# Patient Record
Sex: Male | Born: 1990 | Race: Black or African American | Hispanic: No | Marital: Single | Smoking: Former smoker
Health system: Southern US, Community
[De-identification: ages and names within clinical notes are randomized; demographics above are authoritative.]

## PROBLEM LIST (undated history)

## (undated) DIAGNOSIS — J301 Allergic rhinitis due to pollen: Secondary | ICD-10-CM

## (undated) HISTORY — DX: Allergic rhinitis due to pollen: J30.1

---
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
## 2009-06-29 ENCOUNTER — Emergency Department (HOSPITAL_COMMUNITY): Admission: EM | Admit: 2009-06-29 | Discharge: 2009-06-29 | Payer: Self-pay | Admitting: Emergency Medicine

## 2010-04-03 ENCOUNTER — Emergency Department (HOSPITAL_COMMUNITY)
Admission: EM | Admit: 2010-04-03 | Discharge: 2010-04-03 | Disposition: A | Discharge status: Home / Self Care | Payer: No Typology Code available for payment source | Attending: Emergency Medicine | Admitting: Emergency Medicine

## 2010-04-03 DIAGNOSIS — M546 Pain in thoracic spine: Secondary | ICD-10-CM | POA: Insufficient documentation

## 2010-04-03 DIAGNOSIS — M542 Cervicalgia: Secondary | ICD-10-CM | POA: Insufficient documentation

## 2010-04-03 DIAGNOSIS — Y929 Unspecified place or not applicable: Secondary | ICD-10-CM | POA: Insufficient documentation

## 2014-09-04 ENCOUNTER — Emergency Department (INDEPENDENT_AMBULATORY_CARE_PROVIDER_SITE_OTHER)
Admission: EM | Admit: 2014-09-04 | Discharge: 2014-09-04 | Disposition: A | Discharge status: Home / Self Care | Payer: Self-pay | Source: Home / Self Care

## 2014-09-04 ENCOUNTER — Encounter (HOSPITAL_COMMUNITY): Payer: Self-pay | Admitting: Emergency Medicine

## 2014-09-04 DIAGNOSIS — B349 Viral infection, unspecified: Secondary | ICD-10-CM

## 2014-09-04 DIAGNOSIS — G4452 New daily persistent headache (NDPH): Secondary | ICD-10-CM

## 2014-09-04 MED ORDER — TRAMADOL HCL 50 MG PO TABS: 50.0000 mg | ORAL_TABLET | Freq: Four times a day (QID) | ORAL | Status: DC | PRN

## 2014-09-04 MED ORDER — KETOROLAC TROMETHAMINE 10 MG PO TABS: ORAL_TABLET | ORAL | Status: DC

## 2014-09-04 NOTE — ED Notes (Signed)
Call from pharmacy about this patient recent d/c and toradol rx. NP referred to pharmacy, and he is to call them

## 2014-09-04 NOTE — ED Notes (Signed)
Call from patient. States he was told by the pharmacy they will not fill his rx for toradol tablets since he did not receive an injection while still at the clinic. Discussed this with the provider, who recommends the patient take the Rx to another pharmacy. Patient has been advised of the instructions from the provider

## 2014-09-04 NOTE — ED Notes (Signed)
Reports headache that started on Friday, 7/1.  General aches.  Denies sore throat, ear pain, no abdominal pain or complaints.

## 2014-09-04 NOTE — Discharge Instructions (Signed)
Viral Infections A viral infection can be caused by different types of viruses.Most viral infections are not serious and resolve on their own. However, some infections may cause severe symptoms and may lead to further complications. SYMPTOMS Viruses can frequently cause:  Minor sore throat.  Aches and pains.  Headaches.  Runny nose.  Different types of rashes.  Watery eyes.  Tiredness.  Cough.  Loss of appetite.  Gastrointestinal infections, resulting in nausea, vomiting, and diarrhea. These symptoms do not respond to antibiotics because the infection is not caused by bacteria. However, you might catch a bacterial infection following the viral infection. This is sometimes called a "superinfection." Symptoms of such a bacterial infection may include:  Worsening sore throat with pus and difficulty swallowing.  Swollen neck glands.  Chills and a high or persistent fever.  Severe headache.  Tenderness over the sinuses.  Persistent overall ill feeling (malaise), muscle aches, and tiredness (fatigue).  Persistent cough.  Yellow, green, or brown mucus production with coughing. HOME CARE INSTRUCTIONS   Only take over-the-counter or prescription medicines for pain, discomfort, diarrhea, or fever as directed by your caregiver.  Drink enough water and fluids to keep your urine clear or pale yellow. Sports drinks can provide valuable electrolytes, sugars, and hydration.  Get plenty of rest and maintain proper nutrition. Soups and broths with crackers or rice are fine. SEEK IMMEDIATE MEDICAL CARE IF:   You have severe headaches, shortness of breath, chest pain, neck pain, or an unusual rash.  You have uncontrolled vomiting, diarrhea, or you are unable to keep down fluids.  You or your child has an oral temperature above 102 F (38.9 C), not controlled by medicine.  Your baby is older than 3 months with a rectal temperature of 102 F (38.9 C) or higher.  Your baby is 1  months old or younger with a rectal temperature of 100.4 F (38 C) or higher. MAKE SURE YOU:   Understand these instructions.  Will watch your condition.  Will get help right away if you are not doing well or get worse. Document Released: 11/27/2004 Document Revised: 05/12/2011 Document Reviewed: 06/24/2010 [facility] Patient Information 2015 [place], [place]. This information is not intended to replace advice given to you by your health care provider. Make sure you discuss any questions you have with your health care provider.  Headaches, Frequently Asked Questions MIGRAINE HEADACHES Q: What is migraine? What causes it? How can I treat it? A: Generally, migraine headaches begin as a dull ache. Then they develop into a constant, throbbing, and pulsating pain. You may experience pain at the temples. You may experience pain at the front or back of one or both sides of the head. The pain is usually accompanied by a combination of:  Nausea.  Vomiting.  Sensitivity to light and noise. Some people (about 15%) experience an aura (see below) before an attack. The cause of migraine is believed to be chemical reactions in the brain. Treatment for migraine may include over-the-counter or prescription medications. It may also include self-help techniques. These include relaxation training and biofeedback.  Q: What is an aura? A: About 15% of people with migraine get an "aura". This is a sign of neurological symptoms that occur before a migraine headache. You may see wavy or jagged lines, dots, or flashing lights. You might experience tunnel vision or blind spots in one or both eyes. The aura can include visual or auditory hallucinations (something imagined). It may include disruptions in smell (such as strange odors),  taste or touch. Other symptoms include:  Numbness.  A "pins and needles" sensation.  Difficulty in recalling or speaking the correct word. These neurological events may last as long as  60 minutes. These symptoms will fade as the headache begins. Q: What is a trigger? A: Certain physical or environmental factors can lead to or "trigger" a migraine. These include:  Foods.  Hormonal changes.  Weather.  Stress. It is important to remember that triggers are different for everyone. To help prevent migraine attacks, you need to figure out which triggers affect you. Keep a headache diary. This is a good way to track triggers. The diary will help you talk to your healthcare professional about your condition. Q: Does weather affect migraines? A: Bright sunshine, hot, humid conditions, and drastic changes in barometric pressure may lead to, or "trigger," a migraine attack in some people. But studies have shown that weather does not act as a trigger for everyone with migraines. Q: What is the link between migraine and hormones? A: Hormones start and regulate many of your body's functions. Hormones keep your body in balance within a constantly changing environment. The levels of hormones in your body are unbalanced at times. Examples are during menstruation, pregnancy, or menopause. That can lead to a migraine attack. In fact, about three quarters of all women with migraine report that their attacks are related to the menstrual cycle.  Q: Is there an increased risk of stroke for migraine sufferers? A: The likelihood of a migraine attack causing a stroke is very remote. That is not to say that migraine sufferers cannot have a stroke associated with their migraines. In persons under age 45, the most common associated factor for stroke is migraine headache. But over the course of a person's normal life span, the occurrence of migraine headache may actually be associated with a reduced risk of dying from cerebrovascular disease due to stroke.  Q: What are acute medications for migraine? A: Acute medications are used to treat the pain of the headache after it has started. Examples  over-the-counter medications, NSAIDs, ergots, and triptans.  Q: What are the triptans? A: Triptans are the newest class of abortive medications. They are specifically targeted to treat migraine. Triptans are vasoconstrictors. They moderate some chemical reactions in the brain. The triptans work on receptors in your brain. Triptans help to restore the balance of a neurotransmitter called serotonin. Fluctuations in levels of serotonin are thought to be a main cause of migraine.  Q: Are over-the-counter medications for migraine effective? A: Over-the-counter, or "OTC," medications may be effective in relieving mild to moderate pain and associated symptoms of migraine. But you should see your caregiver before beginning any treatment regimen for migraine.  Q: What are preventive medications for migraine? A: Preventive medications for migraine are sometimes referred to as "prophylactic" treatments. They are used to reduce the frequency, severity, and length of migraine attacks. Examples of preventive medications include antiepileptic medications, antidepressants, beta-blockers, calcium channel blockers, and NSAIDs (nonsteroidal anti-inflammatory drugs). Q: Why are anticonvulsants used to treat migraine? A: During the past few years, there has been an increased interest in antiepileptic drugs for the prevention of migraine. They are sometimes referred to as "anticonvulsants". Both epilepsy and migraine may be caused by similar reactions in the brain.  Q: Why are antidepressants used to treat migraine? A: Antidepressants are typically used to treat people with depression. They may reduce migraine frequency by regulating chemical levels, such as serotonin, in the brain.  Q: What  alternative therapies are used to treat migraine? A: The term "alternative therapies" is often used to describe treatments considered outside the scope of conventional Western medicine. Examples of alternative therapy include acupuncture,  acupressure, and yoga. Another common alternative treatment is herbal therapy. Some herbs are believed to relieve headache pain. Always discuss alternative therapies with your caregiver before proceeding. Some herbal products contain arsenic and other toxins. TENSION HEADACHES Q: What is a tension-type headache? What causes it? How can I treat it? A: Tension-type headaches occur randomly. They are often the result of temporary stress, anxiety, fatigue, or anger. Symptoms include soreness in your temples, a tightening band-like sensation around your head (a "vice-like" ache). Symptoms can also include a pulling feeling, pressure sensations, and contracting head and neck muscles. The headache begins in your forehead, temples, or the back of your head and neck. Treatment for tension-type headache may include over-the-counter or prescription medications. Treatment may also include self-help techniques such as relaxation training and biofeedback. CLUSTER HEADACHES Q: What is a cluster headache? What causes it? How can I treat it? A: Cluster headache gets its name because the attacks come in groups. The pain arrives with little, if any, warning. It is usually on one side of the head. A tearing or bloodshot eye and a runny nose on the same side of the headache may also accompany the pain. Cluster headaches are believed to be caused by chemical reactions in the brain. They have been described as the most severe and intense of any headache type. Treatment for cluster headache includes prescription medication and oxygen. SINUS HEADACHES Q: What is a sinus headache? What causes it? How can I treat it? A: When a cavity in the bones of the face and skull (a sinus) becomes inflamed, the inflammation will cause localized pain. This condition is usually the result of an allergic reaction, a tumor, or an infection. If your headache is caused by a sinus blockage, such as an infection, you will probably have a fever. An x-ray  will confirm a sinus blockage. Your caregiver's treatment might include antibiotics for the infection, as well as antihistamines or decongestants.  REBOUND HEADACHES Q: What is a rebound headache? What causes it? How can I treat it? A: A pattern of taking acute headache medications too often can lead to a condition known as "rebound headache." A pattern of taking too much headache medication includes taking it more than 2 days per week or in excessive amounts. That means more than the label or a caregiver advises. With rebound headaches, your medications not only stop relieving pain, they actually begin to cause headaches. Doctors treat rebound headache by tapering the medication that is being overused. Sometimes your caregiver will gradually substitute a different type of treatment or medication. Stopping may be a challenge. Regularly overusing a medication increases the potential for serious side effects. Consult a caregiver if you regularly use headache medications more than 2 days per week or more than the label advises. ADDITIONAL QUESTIONS AND ANSWERS Q: What is biofeedback? A: Biofeedback is a self-help treatment. Biofeedback uses special equipment to monitor your body's involuntary physical responses. Biofeedback monitors:  Breathing.  Pulse.  Heart rate.  Temperature.  Muscle tension.  Brain activity. Biofeedback helps you refine and perfect your relaxation exercises. You learn to control the physical responses that are related to stress. Once the technique has been mastered, you do not need the equipment any more. Q: Are headaches hereditary? A: Four out of five (80%) of people  that suffer report a family history of migraine. Scientists are not sure if this is genetic or a family predisposition. Despite the uncertainty, a child has a 50% chance of having migraine if one parent suffers. The child has a 75% chance if both parents suffer.  Q: Can children get headaches? A: By the time  they reach high school, most young people have experienced some type of headache. Many safe and effective approaches or medications can prevent a headache from occurring or stop it after it has begun.  Q: What type of doctor should I see to diagnose and treat my headache? A: Start with your primary caregiver. Discuss his or her experience and approach to headaches. Discuss methods of classification, diagnosis, and treatment. Your caregiver may decide to recommend you to a headache specialist, depending upon your symptoms or other physical conditions. Having diabetes, allergies, etc., may require a more comprehensive and inclusive approach to your headache. The National Headache Foundation will provide, upon request, a list of [facility] physician members in your state. Document Released: 05/10/2003 Document Revised: 05/12/2011 Document Reviewed: 10/18/2007 [facility] Patient Information 2015 [place], [place]. This information is not intended to replace advice given to you by your health care provider. Make sure you discuss any questions you have with your health care provider.

## 2014-09-04 NOTE — ED Provider Notes (Signed)
CSN: [id number]     Arrival date & time 09/04/14  1445 History   None    Chief Complaint  Patient presents with  . Headache   (Consider location/radiation/quality/duration/timing/severity/associated sxs/prior Treatment) HPI Comments: 24 year old male developed a nonradiating headache approximate 3 days ago. It is located to the vertex and biparietal areas of the cranium. He describes it as throbbing and is worse when lying down or turning his head. Is also complaining of myalgias, soreness all over, fatigue, malaise. He initially denied fever at where he states when his girlfriend take his temperature was 100.6 at home. Denies sore throat or earache. Denies problems with visions, speech, hearing, swallowing. Denies focal paresthesias or weakness.   History reviewed. No pertinent past medical history. History reviewed. No pertinent past surgical history. No family history on file. History  Substance Use Topics  . Smoking status: Never Smoker   . Smokeless tobacco: Not on file  . Alcohol Use: Yes    Review of Systems  Constitutional: Positive for activity change and fatigue. Negative for chills.  HENT: Negative for congestion, ear discharge, facial swelling, postnasal drip, sinus pressure and sore throat.   Eyes: Negative.   Respiratory: Negative for cough, choking, chest tightness and shortness of breath.   Cardiovascular: Negative for chest pain and leg swelling.  Gastrointestinal: Negative.   Genitourinary: Negative.   Skin: Negative.  Negative for rash.  Neurological: Positive for headaches. Negative for dizziness, tremors, seizures, syncope, facial asymmetry, speech difficulty, weakness, light-headedness and numbness.  Psychiatric/Behavioral: Negative.     Allergies  Review of patient's allergies indicates no known allergies.  Home Medications   Prior to Admission medications   Medication Sig Start Date End Date Taking? Authorizing Provider  Aspirin-Acetaminophen-Caffeine  (EXCEDRIN PO) Take by mouth.   Yes Historical Provider, MD  OVER THE COUNTER MEDICATION Has taken pain medicines left over from prior dental extractions   Yes Historical Provider, MD  ketorolac (TORADOL) 10 MG tablet Take 2 tablets now, then 1 po q 6h prn pain 09/04/14   Hayden Rasmussenavid Zianne Schubring, NP  traMADol (ULTRAM) 50 MG tablet Take 1 tablet (50 mg total) by mouth every 6 (six) hours as needed. 09/04/14   Hayden Rasmussenavid Fredia Chittenden, NP   BP 122/76 mmHg  Pulse 64  Temp(Src) 98.5 F (36.9 C) (Oral)  Resp 16  SpO2 100% Physical Exam  Constitutional: He is oriented to person, place, and time. He appears well-developed and well-nourished. No distress.  HENT:  Head: Normocephalic and atraumatic.  Bilateral TMs are normal Oropharynx is clear and moist without exudates or erythema. Soft palate rises symmetrically. Tongue and uvula midline.  Eyes: Conjunctivae and EOM are normal. Pupils are equal, round, and reactive to light. Right eye exhibits no discharge. Left eye exhibits no discharge.  Neck: Normal range of motion. Neck supple.  Cardiovascular: Normal rate, regular rhythm and normal heart sounds.   Pulmonary/Chest: Effort normal and breath sounds normal. No respiratory distress. He has no wheezes. He has no rales.  Musculoskeletal: Normal range of motion. He exhibits no edema.  Lymphadenopathy:    He has no cervical adenopathy.  Neurological: He is alert and oriented to person, place, and time. He has normal strength. He displays no tremor. No cranial nerve deficit or sensory deficit. He exhibits normal muscle tone. He displays a negative Romberg sign. Coordination and gait normal. GCS eye subscore is 4. GCS verbal subscore is 5. GCS motor subscore is 6.  Reflex Scores:      Patellar reflexes are 2+  on the right side and 2+ on the left side. No dysmetria  Skin: Skin is warm and dry. No rash noted.  Psychiatric: He has a normal mood and affect.  Nursing note and vitals reviewed.   ED Course  Procedures (including  critical care time) Labs Review Labs Reviewed - No data to display  Imaging Review No results found.   MDM   1. Viral syndrome   2. New daily persistent headache    Suspect with a combination of general weakness, myalgias, muscle soreness, low-grade fever and headache at the vertex this is a viral type syndrome. Neurological exam is unremarkable. He appears well otherwise. Rest, drink plenty of fluids stay well-hydrated Tramadol 50 mg 1 every 6 hours as needed for pain or headache Acute oral activity milligrams 2 now then one by mouth every 6 hours when necessary headache discomfort.       Hayden Rasmussen, NP 09/04/14 [phone]

## 2017-11-26 ENCOUNTER — Ambulatory Visit (INDEPENDENT_AMBULATORY_CARE_PROVIDER_SITE_OTHER): Payer: BLUE CROSS/BLUE SHIELD | Admitting: Family Medicine

## 2017-11-26 ENCOUNTER — Encounter: Payer: Self-pay | Admitting: Family Medicine

## 2017-11-26 VITALS — BP 118/70 | HR 68 | Temp 98.9°F | Ht 68.0 in | Wt 166.6 lb

## 2017-11-26 DIAGNOSIS — Z113 Encounter for screening for infections with a predominantly sexual mode of transmission: Secondary | ICD-10-CM | POA: Diagnosis not present

## 2017-11-26 DIAGNOSIS — Z0001 Encounter for general adult medical examination with abnormal findings: Secondary | ICD-10-CM | POA: Diagnosis not present

## 2017-11-26 DIAGNOSIS — Z0000 Encounter for general adult medical examination without abnormal findings: Secondary | ICD-10-CM

## 2017-11-26 DIAGNOSIS — J302 Other seasonal allergic rhinitis: Secondary | ICD-10-CM

## 2017-11-26 DIAGNOSIS — N489 Disorder of penis, unspecified: Secondary | ICD-10-CM

## 2017-11-26 MED ORDER — CLOTRIMAZOLE 1 % EX CREA: 1.0000 "application " | TOPICAL_CREAM | Freq: Two times a day (BID) | CUTANEOUS | 0 refills | Status: DC

## 2017-11-26 NOTE — Patient Instructions (Signed)
Preventive Care 18-39 Years, Male Preventive care refers to lifestyle choices and visits with your health care provider that can promote health and wellness. What does preventive care include?  A yearly physical exam. This is also called an annual well check.  Dental exams once or twice a year.  Routine eye exams. Ask your health care provider how often you should have your eyes checked.  Personal lifestyle choices, including: ? Daily care of your teeth and gums. ? Regular physical activity. ? Eating a healthy diet. ? Avoiding tobacco and drug use. ? Limiting alcohol use. ? Practicing safe sex. What happens during an annual well check? The services and screenings done by your health care provider during your annual well check will depend on your age, overall health, lifestyle risk factors, and family history of disease. Counseling Your health care provider may ask you questions about your:  Alcohol use.  Tobacco use.  Drug use.  Emotional well-being.  Home and relationship well-being.  Sexual activity.  Eating habits.  Work and work Statistician.  Screening You may have the following tests or measurements:  Height, weight, and BMI.  Blood pressure.  Lipid and cholesterol levels. These may be checked every 5 years starting at age 34.  Diabetes screening. This is done by checking your blood sugar (glucose) after you have not eaten for a while (fasting).  Skin check.  Hepatitis C blood test.  Hepatitis B blood test.  Sexually transmitted disease (STD) testing.  Discuss your test results, treatment options, and if necessary, the need for more tests with your health care provider. Vaccines Your health care provider may recommend certain vaccines, such as:  Influenza vaccine. This is recommended every year.  Tetanus, diphtheria, and acellular pertussis (Tdap, Td) vaccine. You may need a Td booster every 10 years.  Varicella vaccine. You may need this if you  have not been vaccinated.  HPV vaccine. If you are 23 or younger, you may need three doses over 6 months.  Measles, mumps, and rubella (MMR) vaccine. You may need at least one dose of MMR.You may also need a second dose.  Pneumococcal 13-valent conjugate (PCV13) vaccine. You may need this if you have certain conditions and have not been vaccinated.  Pneumococcal polysaccharide (PPSV23) vaccine. You may need one or two doses if you smoke cigarettes or if you have certain conditions.  Meningococcal vaccine. One dose is recommended if you are age 65-21 years and a first-year college student living in a residence hall, or if you have one of several medical conditions. You may also need additional booster doses.  Hepatitis A vaccine. You may need this if you have certain conditions or if you travel or work in places where you may be exposed to hepatitis A.  Hepatitis B vaccine. You may need this if you have certain conditions or if you travel or work in places where you may be exposed to hepatitis B.  Haemophilus influenzae type b (Hib) vaccine. You may need this if you have certain risk factors.  Talk to your health care provider about which screenings and vaccines you need and how often you need them. This information is not intended to replace advice given to you by your health care provider. Make sure you discuss any questions you have with your health care provider. Document Released: 04/15/2001 Document Revised: 11/07/2015 Document Reviewed: 12/19/2014 Elsevier Interactive Patient Education  [employer].

## 2017-11-26 NOTE — Progress Notes (Signed)
Subjective:     Jose Dean is a 27 y.o. male and is here for a comprehensive physical exam. The patient reports problems - penile concern.  Pt would also like STI testing.  Has been in a relationship x 8 months, not using condoms.  Reports an area on his penis that has been slow to heal.  Pt notes area initially started as a small bump, the bump has resolved, but the area is dry.  Pt denies any burning, discharge, or urinary complaints.  Pt notes h/o seasonal allergies.  Not currently taking anything.  Allergies: NKDA  Social hx: Pt is single.  He is employed as a Naval architect.  Pt endorses EtOH use.  Pt not currently using tobacco products, endorses smoking in the past.  Social History   Socioeconomic History  . Marital status: Single    Spouse name: Not on file  . Number of children: Not on file  . Years of education: Not on file  . Highest education level: Not on file  Occupational History  . Not on file  Social Needs  . Financial resource strain: Not on file  . Food insecurity:    Worry: Not on file    Inability: Not on file  . Transportation needs:    Medical: Not on file    Non-medical: Not on file  Tobacco Use  . Smoking status: Former Games developer  . Smokeless tobacco: Never Used  Substance and Sexual Activity  . Alcohol use: Yes  . Drug use: No  . Sexual activity: Yes  Lifestyle  . Physical activity:    Days per week: Not on file    Minutes per session: Not on file  . Stress: Not on file  Relationships  . Social connections:    Talks on phone: Not on file    Gets together: Not on file    Attends religious service: Not on file    Active member of club or organization: Not on file    Attends meetings of clubs or organizations: Not on file    Relationship status: Not on file  . Intimate partner violence:    Fear of current or ex partner: Not on file    Emotionally abused: Not on file    Physically abused: Not on file    Forced sexual activity: Not on file   Other Topics Concern  . Not on file  Social History Narrative  . Not on file   Health Maintenance  Topic Date Due  . HIV Screening  08/20/2003  . TETANUS/TDAP  08/20/2007  . INFLUENZA VACCINE  10/01/2017    The following portions of the patient's history were reviewed and updated as appropriate: allergies, current medications, past family history, past medical history, past social history, past surgical history and problem list.  Review of Systems A comprehensive review of systems was negative.   +penile lesion  Objective:    BP 118/70 (BP Location: Left Arm, Patient Position: Sitting, Cuff Size: Normal)   Pulse 68   Temp 98.9 F (37.2 C) (Oral)   Ht 5\' 8"  (1.727 m)   Wt 166 lb 9.6 oz (75.6 kg)   SpO2 98%   BMI 25.33 kg/m  General appearance: alert, cooperative and no distress Head: Normocephalic, without obvious abnormality, atraumatic Eyes: conjunctivae/corneas clear. PERRL, EOM's intact. Fundi benign. Ears: normal TM's and external ear canals both ears Nose: Nares normal. Septum midline. Mucosa normal. No drainage or sinus tenderness. Throat: lips, mucosa, and tongue normal; teeth and  gums normal Neck: no adenopathy, no carotid bruit, no JVD, supple, symmetrical, trachea midline and thyroid not enlarged, symmetric, no tenderness/mass/nodules Lungs: clear to auscultation bilaterally Heart: regular rate and rhythm, S1, S2 normal, no murmur, click, rub or gallop Abdomen: soft, non-tender; bowel sounds normal; no masses,  no organomegaly Male genitalia: Circumcised male. skin of penile shaft dry, 1 cm area of dry slightly hyperpigmented skin on L lateral penis.  no pustules, erythema, or drainage noted. Extremities: extremities normal, atraumatic, no cyanosis or edema Skin: Skin color, texture, turgor normal. No rashes or lesions Neurologic: Alert and oriented X 3, normal strength and tone. Normal symmetric reflexes. Normal coordination and gait    Assessment:    Healthy  male exam with penile lesion.     Plan:     Anticipatory guidance given including wearing seatbelts, smoke detectors in the home, increasing physical activity, increasing p.o. intake of water and vegetables. -will obtain labs for STI screening -given handouts -offered influenza vaccine, pt declined -next CPE in 1 yr See After Visit Summary for Counseling Recommendations    Penile lesion -unclear as to how original lesion appeared. -discussed possible causes including HSV infection, tinea, dermatitis, etc. -HSV testing -lotrimazole cream -pt given RTC precautions if area becomes worse or pt notes other lesions  Seasonal allergies -OTC med prn  F/u prn  Abbe Amsterdam, MD

## 2017-11-27 LAB — HIV ANTIBODY (ROUTINE TESTING W REFLEX): HIV 1&2 Ab, 4th Generation: NONREACTIVE

## 2017-11-27 LAB — C. TRACHOMATIS/N. GONORRHOEAE RNA
C. trachomatis RNA, TMA: NOT DETECTED
N. gonorrhoeae RNA, TMA: NOT DETECTED

## 2017-11-27 LAB — RPR: RPR: NONREACTIVE

## 2017-11-27 LAB — HSV(HERPES SIMPLEX VRS) I + II AB-IGG: HSV 1 IGG, TYPE SPEC: 0.9 {index} — AB

## 2017-11-29 ENCOUNTER — Encounter: Payer: Self-pay | Admitting: Family Medicine

## 2017-11-30 ENCOUNTER — Telehealth: Payer: Self-pay | Admitting: *Deleted

## 2017-11-30 NOTE — Telephone Encounter (Signed)
Copied from CRM [id number]. Topic: Inquiry >> Nov 30, 2017  2:17 PM Baldo Daub L wrote: Reason for CRM:   Pt calling to find out if results were in.  Pt can be reached at [phone]

## 2017-12-01 NOTE — Telephone Encounter (Signed)
Pt has already been given his Lab results

## 2018-05-14 ENCOUNTER — Other Ambulatory Visit: Payer: Self-pay

## 2018-05-14 ENCOUNTER — Ambulatory Visit (INDEPENDENT_AMBULATORY_CARE_PROVIDER_SITE_OTHER): Payer: BLUE CROSS/BLUE SHIELD | Admitting: Family Medicine

## 2018-05-14 ENCOUNTER — Encounter: Payer: Self-pay | Admitting: Family Medicine

## 2018-05-14 VITALS — BP 110/68 | HR 76 | Temp 98.5°F | Wt 179.0 lb

## 2018-05-14 DIAGNOSIS — N529 Male erectile dysfunction, unspecified: Secondary | ICD-10-CM

## 2018-05-14 NOTE — Progress Notes (Signed)
Subjective:    Patient ID: Jose Dean, male    DOB: [birth year], 28 y.o.   MRN: [id number]  No chief complaint on file.   HPI Patient was seen today for f/u.  Pt endorses ED.  States unable to maintain erection during sex when changes position.  Denies increased stress or being distracted.  Pt went to [facility] on [address] receiving Testosterone injections weekly.  Endorses recent blood work at the clinic.  Notes continued issues.  Drinking mostly water, may have 1 soda per day.  Pt endorses social EtOH use.  Pt denies tobacco and drug use.  Pt working 3rd shift.  Past Medical History:  Diagnosis Date  . Hay fever     No Known Allergies  ROS General: Denies fever, chills, night sweats, changes in weight, changes in appetite HEENT: Denies headaches, ear pain, changes in vision, rhinorrhea, sore throat CV: Denies CP, palpitations, SOB, orthopnea Pulm: Denies SOB, cough, wheezing GI: Denies abdominal pain, nausea, vomiting, diarrhea, constipation GU: Denies dysuria, hematuria, frequency, vaginal discharge  +ED Msk: Denies muscle cramps, joint pains Neuro: Denies weakness, numbness, tingling Skin: Denies rashes, bruising Psych: Denies depression, anxiety, hallucinations    Objective:    Blood pressure 110/68, pulse 76, temperature 98.5 F (36.9 C), temperature source Oral, weight 179 lb (81.2 kg), SpO2 98 %.  Gen. Pleasant, well-nourished, in no distress, normal affect   HEENT: [place]/AT, face symmetric, no scleral icterus, PERRLA, nares patent without drainage. Lungs: no accessory muscle use, CTAB, no wheezes or rales Cardiovascular: RRR, no m/r/g, no peripheral edema Neuro:  A&Ox3, CN II-XII intact, normal gait  Wt Readings from Last 3 Encounters:  05/14/18 179 lb (81.2 kg)  11/26/17 166 lb 9.6 oz (75.6 kg)    No results found for: WBC, HGB, HCT, PLT, GLUCOSE, CHOL, TRIG, HDL, LDLDIRECT, LDLCALC, ALT, AST, NA, K, CL, CREATININE, BUN, CO2, TSH, PSA, INR,  GLUF, HGBA1C, MICROALBUR  Assessment/Plan:  Erectile dysfunction, unspecified erectile dysfunction type -Receiving testosterone injections from [facility] on [address].  Unsure of actual testosterone level as patient does not have labs with him -Attempted to reassure patient.  Discussed possible causes of ED.  Stated testosterone replacement should help if truly low.  Patient skeptical. -advised h/o low normal blood pressure may not support addition of a phosphodiesterase inhibitor -Given handout - Plan: Ambulatory referral to Urology  F/u prn  Abbe Amsterdam, MD

## 2018-05-14 NOTE — Patient Instructions (Signed)
Erectile Dysfunction  Erectile dysfunction (ED) is the inability to get or keep an erection in order to have sexual intercourse. Erectile dysfunction may include:   Inability to get an erection.   Lack of enough hardness of the erection to allow penetration.   Loss of the erection before sex is finished.  What are the causes?  This condition may be caused by:   Certain medicines, such as:  ? Pain relievers.  ? Antihistamines.  ? Antidepressants.  ? Blood pressure medicines.  ? Water pills (diuretics).  ? Ulcer medicines.  ? Muscle relaxants.  ? Drugs.   Excessive drinking.   Psychological causes, such as:  ? Anxiety.  ? Depression.  ? Sadness.  ? Exhaustion.  ? Performance fear.  ? Stress.   Physical causes, such as:  ? Artery problems. This may include diabetes, smoking, liver disease, or atherosclerosis.  ? High blood pressure.  ? Hormonal problems, such as low testosterone.  ? Obesity.  ? Nerve problems. This may include back or pelvic injuries, diabetes mellitus, multiple sclerosis, or Parkinson disease.  What are the signs or symptoms?  Symptoms of this condition include:   Inability to get an erection.   Lack of enough hardness of the erection to allow penetration.   Loss of the erection before sex is finished.   Normal erections at some times, but with frequent unsatisfactory episodes.   Low sexual satisfaction in either partner due to erection problems.   A curved penis occurring with erection. The curve may cause pain or the penis may be too curved to allow for intercourse.   Never having nighttime erections.  How is this diagnosed?  This condition is often diagnosed by:   Performing a physical exam to find other diseases or specific problems with the penis.   Asking you detailed questions about the problem.   Performing blood tests to check for diabetes mellitus or to measure hormone levels.   Performing other tests to check for underlying health conditions.   Performing an ultrasound  exam to check for scarring.   Performing a test to check blood flow to the penis.   Doing a sleep study at home to measure nighttime erections.  How is this treated?  This condition may be treated by:   Medicine taken by mouth to help you achieve an erection (oral medicine).   Hormone replacement therapy to replace low testosterone levels.   Medicine that is injected into the penis. Your health care provider may instruct you how to give yourself these injections at home.   Vacuum pump. This is a pump with a ring on it. The pump and ring are placed on the penis and used to create pressure that helps the penis become erect.   Penile implant surgery. In this procedure, you may receive:  ? An inflatable implant. This consists of cylinders, a pump, and a reservoir. The cylinders can be inflated with a fluid that helps to create an erection, and they can be deflated after intercourse.  ? A semi-rigid implant. This consists of two silicone rubber rods. The rods provide some rigidity. They are also flexible, so the penis can both curve downward in its normal position and become straight for sexual intercourse.   Blood vessel surgery, to improve blood flow to the penis. During this procedure, a blood vessel from a different part of the body is placed into the penis to allow blood to flow around (bypass) damaged or blocked blood vessels.     Lifestyle changes, such as exercising more, losing weight, and quitting smoking.  Follow these instructions at home:  Medicines     Take over-the-counter and prescription medicines only as told by your health care provider. Do not increase the dosage without first discussing it with your health care provider.   If you are using self-injections, perform injections as directed by your health care provider. Make sure to avoid any veins that are on the surface of the penis. After giving an injection, apply pressure to the injection site for 5 minutes.  General  instructions   Exercise regularly, as directed by your health care provider. Work with your health care provider to lose weight, if needed.   Do not use any products that contain nicotine or tobacco, such as cigarettes and e-cigarettes. If you need help quitting, ask your health care provider.   Before using a vacuum pump, read the instructions that come with the pump and discuss any questions with your health care provider.   Keep all follow-up visits as told by your health care provider. This is important.  Contact a health care provider if:   You feel nauseous.   You vomit.  Get help right away if:   You are taking oral or injectable medicines and you have an erection that lasts longer than 4 hours. If your health care provider is unavailable, go to the nearest emergency room for evaluation. An erection that lasts much longer than 4 hours can result in permanent damage to your penis.   You have severe pain in your groin or abdomen.   You develop redness or severe swelling of your penis.   You have redness spreading up into your groin or lower abdomen.   You are unable to urinate.   You experience chest pain or a rapid heart beat (palpitations) after taking oral medicines.  Summary   Erectile dysfunction (ED) is the inability to get or keep an erection during sexual intercourse. This problem can usually be treated successfully.   This condition is diagnosed based on a physical exam, your symptoms, and tests to determine the cause. Treatment varies depending on the cause, and may include medicines, hormone therapy, surgery, or vacuum pump.   You may need follow-up visits to make sure that you are using your medicines or devices correctly.   Get help right away if you are taking or injecting medicines and you have an erection that lasts longer than 4 hours.  This information is not intended to replace advice given to you by your health care provider. Make sure you discuss any questions you have with  your health care provider.  Document Released: 02/15/2000 Document Revised: 03/05/2016 Document Reviewed: 03/05/2016  Elsevier Interactive Patient Education  2019 [employer]

## 2018-05-17 ENCOUNTER — Encounter: Payer: Self-pay | Admitting: Family Medicine

## 2018-07-22 ENCOUNTER — Ambulatory Visit (INDEPENDENT_AMBULATORY_CARE_PROVIDER_SITE_OTHER): Payer: BLUE CROSS/BLUE SHIELD | Admitting: Family Medicine

## 2018-07-22 ENCOUNTER — Other Ambulatory Visit: Payer: Self-pay

## 2018-07-22 ENCOUNTER — Encounter: Payer: Self-pay | Admitting: Family Medicine

## 2018-07-22 DIAGNOSIS — Z566 Other physical and mental strain related to work: Secondary | ICD-10-CM

## 2018-07-22 NOTE — Progress Notes (Signed)
Virtual Visit via Video Note  I connected with Jose Dean on 07/22/18 at  3:30 PM EDT by a video enabled telemedicine application and verified that I am speaking with the correct person using two identifiers.  Location patient: home Location provider:work or home office Persons participating in the virtual visit: patient, provider  I discussed the limitations of evaluation and management by telemedicine and the availability of in person appointments. The patient expressed understanding and agreed to proceed.   HPI: Pt endorses increased stress at work. Pt is vague in regards to details, but feels like "there is a target on my back".  Pt endorses doing his job, but people are trying to make it seem like he is not.  The increased stress is making him feel sick.  States left work early last night as heart was racing and could not calm down.  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  States stress level is a 10/10.  States his sleep is not good, he appetite is decreased, mood is alright.  Denies anxiety or depression.  May drink 1 cup of coffee per day.     ROS: See pertinent positives and negatives per HPI.  Past Medical History:  Diagnosis Date  . Hay fever     No past surgical history on file.  No family history on file.  SOCIAL HX: Pt considering finding a new job.  Current Outpatient Medications:  .  TESTOSTERONE CYPIONATE IJ, Inject as directed once a week., Disp: , Rfl:   EXAM:  VITALS per patient if applicable:  RR between 12-20 bpm  GENERAL: alert, oriented, appears well and in no acute distress  HEENT: atraumatic, conjunctiva clear, no obvious abnormalities on inspection of external nose and ears  NECK: normal movements of the head and neck  LUNGS: on inspection no signs of respiratory distress, breathing rate appears normal, no obvious gross SOB, gasping or  wheezing  CV: no obvious cyanosis  MS: moves all visible extremities without noticeable abnormality  PSYCH/NEURO: pleasant and cooperative, no obvious depression or anxiety, speech and thought processing grossly intact  ASSESSMENT AND PLAN:  Discussed the following assessment and plan:  Stress at work  -pt given tips to help relieve stress  -will provide not stating pt was seen today -consider counseling. -f/u prn   I discussed the assessment and treatment plan with the patient. The patient was provided an opportunity to ask questions and all were answered. The patient agreed with the plan and demonstrated an understanding of the instructions.   The patient was advised to call back or seek an in-person evaluation if the symptoms worsen or if the condition fails to improve as anticipated.  Deeann Saint, MD

## 2019-10-18 ENCOUNTER — Ambulatory Visit: Payer: BLUE CROSS/BLUE SHIELD

## 2019-11-18 ENCOUNTER — Other Ambulatory Visit: Payer: Self-pay

## 2019-11-18 DIAGNOSIS — Z20822 Contact with and (suspected) exposure to covid-19: Secondary | ICD-10-CM

## 2019-11-21 LAB — NOVEL CORONAVIRUS, NAA: SARS-CoV-2, NAA: NOT DETECTED

## 2020-02-08 ENCOUNTER — Ambulatory Visit (INDEPENDENT_AMBULATORY_CARE_PROVIDER_SITE_OTHER): Payer: Self-pay | Admitting: Family Medicine

## 2020-02-08 ENCOUNTER — Other Ambulatory Visit: Payer: Self-pay

## 2020-02-08 ENCOUNTER — Encounter: Payer: Self-pay | Admitting: Family Medicine

## 2020-02-08 VITALS — BP 104/60 | HR 73 | Temp 98.3°F | Wt 187.6 lb

## 2020-02-08 DIAGNOSIS — F5101 Primary insomnia: Secondary | ICD-10-CM

## 2020-02-08 DIAGNOSIS — F32A Depression, unspecified: Secondary | ICD-10-CM

## 2020-02-08 DIAGNOSIS — R5383 Other fatigue: Secondary | ICD-10-CM

## 2020-02-08 DIAGNOSIS — F810 Specific reading disorder: Secondary | ICD-10-CM

## 2020-02-08 DIAGNOSIS — F419 Anxiety disorder, unspecified: Secondary | ICD-10-CM

## 2020-02-08 DIAGNOSIS — R630 Anorexia: Secondary | ICD-10-CM

## 2020-02-08 NOTE — Patient Instructions (Addendum)
Fatigue If you have fatigue, you feel tired all the time and have a lack of energy or a lack of motivation. Fatigue may make it difficult to start or complete tasks because of exhaustion. In general, occasional or mild fatigue is often a normal response to activity or life. However, long-lasting (chronic) or extreme fatigue may be a symptom of a medical condition. Follow these instructions at home: General instructions  Watch your fatigue for any changes.  Go to bed and get up at the same time every day.  Avoid fatigue by pacing yourself during the day and getting enough sleep at night.  Maintain a healthy weight. Medicines  Take over-the-counter and prescription medicines only as told by your health care provider.  Take a multivitamin, if told by your health care provider.  Do not use herbal or dietary supplements unless they are approved by your health care provider. Activity   Exercise regularly, as told by your health care provider.  Use or practice techniques to help you relax, such as yoga, tai chi, meditation, or massage therapy. Eating and drinking   Avoid heavy meals in the evening.  Eat a well-balanced diet, which includes lean proteins, whole grains, plenty of fruits and vegetables, and low-fat dairy products.  Avoid consuming too much caffeine.  Avoid the use of alcohol.  Drink enough fluid to keep your urine pale yellow. Lifestyle  Change situations that cause you stress. Try to keep your work and personal schedule in balance.  Do not use any products that contain nicotine or tobacco, such as cigarettes and e-cigarettes. If you need help quitting, ask your health care provider.  Do not use drugs. Contact a health care provider if:  Your fatigue does not get better.  You have a fever.  You suddenly lose or gain weight.  You have headaches.  You have trouble falling asleep or sleeping through the night.  You feel angry, guilty, anxious, or  sad.  You are unable to have a bowel movement (constipation).  Your skin is dry.  You have swelling in your legs or another part of your body. Get help right away if:  You feel confused.  Your vision is blurry.  You feel faint or you pass out.  You have a severe headache.  You have severe pain in your abdomen, your back, or the area between your waist and hips (pelvis).  You have chest pain, shortness of breath, or an irregular or fast heartbeat.  You are unable to urinate, or you urinate less than normal.  You have abnormal bleeding, such as bleeding from the rectum, vagina, nose, lungs, or nipples.  You vomit blood.  You have thoughts about hurting yourself or others. If you ever feel like you may hurt yourself or others, or have thoughts about taking your own life, get help right away. You can go to your nearest emergency department or call:  Your local emergency services (911 in the [place]  A suicide crisis helpline, such as the [place] at 9164775271. This is open 24 hours a day. Summary  If you have fatigue, you feel tired all the time and have a lack of energy or a lack of motivation.  Fatigue may make it difficult to start or complete tasks because of exhaustion.  Long-lasting (chronic) or extreme fatigue may be a symptom of a medical condition.  Exercise regularly, as told by your health care provider.  Change situations that cause you stress. Try to keep your  work and personal schedule in balance. This information is not intended to replace advice given to you by your health care provider. Make sure you discuss any questions you have with your health care provider. Document Revised: 09/08/2018 Document Reviewed: 11/12/2016 Elsevier Patient Education  [place].  Insomnia Insomnia is a sleep disorder that makes it difficult to fall asleep or stay asleep. Insomnia can cause fatigue, low energy, difficulty concentrating,  mood swings, and poor performance at work or school. There are three different ways to classify insomnia:  Difficulty falling asleep.  Difficulty staying asleep.  Waking up too early in the morning. Any type of insomnia can be long-term (chronic) or short-term (acute). Both are common. Short-term insomnia usually lasts for three months or less. Chronic insomnia occurs at least three times a week for longer than three months. What are the causes? Insomnia may be caused by another condition, situation, or substance, such as:  Anxiety.  Certain medicines.  Gastroesophageal reflux disease (GERD) or other gastrointestinal conditions.  Asthma or other breathing conditions.  Restless legs syndrome, sleep apnea, or other sleep disorders.  Chronic pain.  Menopause.  Stroke.  Abuse of alcohol, tobacco, or illegal drugs.  Mental health conditions, such as depression.  Caffeine.  Neurological disorders, such as Alzheimer's disease.  An overactive thyroid (hyperthyroidism). Sometimes, the cause of insomnia may not be known. What increases the risk? Risk factors for insomnia include:  Gender. Women are affected more often than men.  Age. Insomnia is more common as you get older.  Stress.  Lack of exercise.  Irregular work schedule or working night shifts.  Traveling between different time zones.  Certain medical and mental health conditions. What are the signs or symptoms? If you have insomnia, the main symptom is having trouble falling asleep or having trouble staying asleep. This may lead to other symptoms, such as:  Feeling fatigued or having low energy.  Feeling nervous about going to sleep.  Not feeling rested in the morning.  Having trouble concentrating.  Feeling irritable, anxious, or depressed. How is this diagnosed? This condition may be diagnosed based on:  Your symptoms and medical history. Your health care provider may ask about: ? Your sleep  habits. ? Any medical conditions you have. ? Your mental health.  A physical exam. How is this treated? Treatment for insomnia depends on the cause. Treatment may focus on treating an underlying condition that is causing insomnia. Treatment may also include:  Medicines to help you sleep.  Counseling or therapy.  Lifestyle adjustments to help you sleep better. Follow these instructions at home: Eating and drinking   Limit or avoid alcohol, caffeinated beverages, and cigarettes, especially close to bedtime. These can disrupt your sleep.  Do not eat a large meal or eat spicy foods right before bedtime. This can lead to digestive discomfort that can make it hard for you to sleep. Sleep habits   Keep a sleep diary to help you and your health care provider figure out what could be causing your insomnia. Write down: ? When you sleep. ? When you wake up during the night. ? How well you sleep. ? How rested you feel the next day. ? Any side effects of medicines you are taking. ? What you eat and drink.  Make your bedroom a dark, comfortable place where it is easy to fall asleep. ? Put up shades or blackout curtains to block light from outside. ? Use a white noise machine to block noise. ? Keep  the temperature cool.  Limit screen use before bedtime. This includes: ? Watching TV. ? Using your smartphone, tablet, or computer.  Stick to a routine that includes going to bed and waking up at the same times every day and night. This can help you fall asleep faster. Consider making a quiet activity, such as reading, part of your nighttime routine.  Try to avoid taking naps during the day so that you sleep better at night.  Get out of bed if you are still awake after 15 minutes of trying to sleep. Keep the lights down, but try reading or doing a quiet activity. When you feel sleepy, go back to bed. General instructions  Take over-the-counter and prescription medicines only as told by your  health care provider.  Exercise regularly, as told by your health care provider. Avoid exercise starting several hours before bedtime.  Use relaxation techniques to manage stress. Ask your health care provider to suggest some techniques that may work well for you. These may include: ? Breathing exercises. ? Routines to release muscle tension. ? Visualizing peaceful scenes.  Make sure that you drive carefully. Avoid driving if you feel very sleepy.  Keep all follow-up visits as told by your health care provider. This is important. Contact a health care provider if:  You are tired throughout the day.  You have trouble in your daily routine due to sleepiness.  You continue to have sleep problems, or your sleep problems get worse. Get help right away if:  You have serious thoughts about hurting yourself or someone else. If you ever feel like you may hurt yourself or others, or have thoughts about taking your own life, get help right away. You can go to your nearest emergency department or call:  Your local emergency services (911 in the [place]  A suicide crisis helpline, such as the [facility] at 740-229-3720. This is open 24 hours a day. Summary  Insomnia is a sleep disorder that makes it difficult to fall asleep or stay asleep.  Insomnia can be long-term (chronic) or short-term (acute).  Treatment for insomnia depends on the cause. Treatment may focus on treating an underlying condition that is causing insomnia.  Keep a sleep diary to help you and your health care provider figure out what could be causing your insomnia. This information is not intended to replace advice given to you by your health care provider. Make sure you discuss any questions you have with your health care provider. Document Revised: 01/30/2017 Document Reviewed: 11/27/2016 Elsevier Patient Education  [place], Adult After being diagnosed with an  anxiety disorder, you may be relieved to know why you have felt or behaved a certain way. You may also feel overwhelmed about the treatment ahead and what it will mean for your life. With care and support, you can manage this condition and recover from it. How to manage lifestyle changes Managing stress and anxiety  Stress is your body's reaction to life changes and events, both good and bad. Most stress will last just a few hours, but stress can be ongoing and can lead to more than just stress. Although stress can play a major role in anxiety, it is not the same as anxiety. Stress is usually caused by something external, such as a deadline, test, or competition. Stress normally passes after the triggering event has ended.  Anxiety is caused by something internal, such as imagining a terrible outcome or worrying that something will  go wrong that will devastate you. Anxiety often does not go away even after the triggering event is over, and it can become long-term (chronic) worry. It is important to understand the differences between stress and anxiety and to manage your stress effectively so that it does not lead to an anxious response. Talk with your health care provider or a counselor to learn more about reducing anxiety and stress. He or she may suggest tension reduction techniques, such as:  Music therapy. This can include creating or listening to music that you enjoy and that inspires you.  Mindfulness-based meditation. This involves being aware of your normal breaths while not trying to control your breathing. It can be done while sitting or walking.  Centering prayer. This involves focusing on a word, phrase, or sacred image that means something to you and brings you peace.  Deep breathing. To do this, expand your stomach and inhale slowly through your nose. Hold your breath for 3-5 seconds. Then exhale slowly, letting your stomach muscles relax.  Self-talk. This involves identifying thought  patterns that lead to anxiety reactions and changing those patterns.  Muscle relaxation. This involves tensing muscles and then relaxing them. Choose a tension reduction technique that suits your lifestyle and personality. These techniques take time and practice. Set aside 5-15 minutes a day to do them. Therapists can offer counseling and training in these techniques. The training to help with anxiety may be covered by some insurance plans. Other things you can do to manage stress and anxiety include:  Keeping a stress/anxiety diary. This can help you learn what triggers your reaction and then learn ways to manage your response.  Thinking about how you react to certain situations. You may not be able to control everything, but you can control your response.  Making time for activities that help you relax and not feeling guilty about spending your time in this way.  Visual imagery and yoga can help you stay calm and relax.  Medicines Medicines can help ease symptoms. Medicines for anxiety include:  Anti-anxiety drugs.  Antidepressants. Medicines are often used as a primary treatment for anxiety disorder. Medicines will be prescribed by a health care provider. When used together, medicines, psychotherapy, and tension reduction techniques may be the most effective treatment. Relationships Relationships can play a big part in helping you recover. Try to spend more time connecting with trusted friends and family members. Consider going to couples counseling, taking family education classes, or going to family therapy. Therapy can help you and others better understand your condition. How to recognize changes in your anxiety Everyone responds differently to treatment for anxiety. Recovery from anxiety happens when symptoms decrease and stop interfering with your daily activities at home or work. This may mean that you will start to:  Have better concentration and focus. Worry will interfere less in  your daily thinking.  Sleep better.  Be less irritable.  Have more energy.  Have improved memory. It is important to recognize when your condition is getting worse. Contact your health care provider if your symptoms interfere with home or work and you feel like your condition is not improving. Follow these instructions at home: Activity  Exercise. Most adults should do the following: ? Exercise for at least 150 minutes each week. The exercise should increase your heart rate and make you sweat (moderate-intensity exercise). ? Strengthening exercises at least twice a week.  Get the right amount and quality of sleep. Most adults need 7-9 hours of sleep  each night. Lifestyle   Eat a healthy diet that includes plenty of vegetables, fruits, whole grains, low-fat dairy products, and lean protein. Do not eat a lot of foods that are high in solid fats, added sugars, or salt.  Make choices that simplify your life.  Do not use any products that contain nicotine or tobacco, such as cigarettes, e-cigarettes, and chewing tobacco. If you need help quitting, ask your health care provider.  Avoid caffeine, alcohol, and certain over-the-counter cold medicines. These may make you feel worse. Ask your pharmacist which medicines to avoid. General instructions  Take over-the-counter and prescription medicines only as told by your health care provider.  Keep all follow-up visits as told by your health care provider. This is important. Where to find support You can get help and support from these sources:  Self-help groups.  Online and [employer].  A trusted spiritual leader.  Couples counseling.  Family education classes.  Family therapy. Where to find more information You may find that joining a support group helps you deal with your anxiety. The following sources can help you locate counselors or support groups near you:  [place]:  www.mentalhealthamerica.net  Anxiety and Depression Association of [place] (ADAA): https://www.[place].net/  National Alliance on Mental Illness (NAMI): www.nami.org Contact a health care provider if you:  Have a hard time staying focused or finishing daily tasks.  Spend many hours a day feeling worried about everyday life.  Become exhausted by worry.  Start to have headaches, feel tense, or have nausea.  Urinate more than normal.  Have diarrhea. Get help right away if you have:  A racing heart and shortness of breath.  Thoughts of hurting yourself or others. If you ever feel like you may hurt yourself or others, or have thoughts about taking your own life, get help right away. You can go to your nearest emergency department or call:  Your local emergency services (911 in the [place]  A suicide crisis helpline, such as the [place] at (973)856-7451. This is open 24 hours a day. Summary  Taking steps to learn and use tension reduction techniques can help calm you and help prevent triggering an anxiety reaction.  When used together, medicines, psychotherapy, and tension reduction techniques may be the most effective treatment.  Family, friends, and partners can play a big part in helping you recover from an anxiety disorder. This information is not intended to replace advice given to you by your health care provider. Make sure you discuss any questions you have with your health care provider. Document Revised: 07/20/2018 Document Reviewed: 07/20/2018 Elsevier Patient Education  [place] With Depression Everyone experiences occasional disappointment, sadness, and loss in their lives. When you are feeling down, blue, or sad for at least 2 weeks in a row, it may mean that you have depression. Depression can affect your thoughts and feelings, relationships, daily activities, and physical health. It is caused by changes in the way your brain  functions. If you receive a diagnosis of depression, your health care provider will tell you which type of depression you have and what treatment options are available to you. If you are living with depression, there are ways to help you recover from it and also ways to prevent it from coming back. How to cope with lifestyle changes Coping with stress     Stress is your body's reaction to life changes and events, both good and bad. Stressful situations may include:  Getting married.  The death of a spouse.  Losing a job.  Retiring.  Having a baby. Stress can last just a few hours or it can be ongoing. Stress can play a major role in depression, so it is important to learn both how to cope with stress and how to think about it differently. Talk with your health care provider or a counselor if you would like to learn more about stress reduction. He or she may suggest some stress reduction techniques, such as:  Music therapy. This can include creating music or listening to music. Choose music that you enjoy and that inspires you.  Mindfulness-based meditation. This kind of meditation can be done while sitting or walking. It involves being aware of your normal breaths, rather than trying to control your breathing.  Centering prayer. This is a kind of meditation that involves focusing on a spiritual word or phrase. Choose a word, phrase, or sacred image that is meaningful to you and that brings you peace.  Deep breathing. To do this, expand your stomach and inhale slowly through your nose. Hold your breath for 3-5 seconds, then exhale slowly, allowing your stomach muscles to relax.  Muscle relaxation. This involves intentionally tensing muscles then relaxing them. Choose a stress reduction technique that fits your lifestyle and personality. Stress reduction techniques take time and practice to develop. Set aside 5-15 minutes a day to do them. Therapists can offer training in these  techniques. The training may be covered by some insurance plans. Other things you can do to manage stress include:  Keeping a stress diary. This can help you learn what triggers your stress and ways to control your response.  Understanding what your limits are and saying no to requests or events that lead to a schedule that is too full.  Thinking about how you respond to certain situations. You may not be able to control everything, but you can control how you react.  Adding humor to your life by watching funny films or TV shows.  Making time for activities that help you relax and not feeling guilty about spending your time this way.  Medicines Your health care provider may suggest certain medicines if he or she feels that they will help improve your condition. Avoid using alcohol and other substances that may prevent your medicines from working properly (may interact). It is also important to:  Talk with your pharmacist or health care provider about all the medicines that you take, their possible side effects, and what medicines are safe to take together.  Make it your goal to take part in all treatment decisions (shared decision-making). This includes giving input on the side effects of medicines. It is best if shared decision-making with your health care provider is part of your total treatment plan. If your health care provider prescribes a medicine, you may not notice the full benefits of it for 4-8 weeks. Most people who are treated for depression need to be on medicine for at least 6-12 months after they feel better. If you are taking medicines as part of your treatment, do not stop taking medicines without first talking to your health care provider. You may need to have the medicine slowly decreased (tapered) over time to decrease the risk of harmful side effects. Relationships Your health care provider may suggest family therapy along with individual therapy and drug therapy. While there  may not be family problems that are causing you to feel depressed, it is still important to make  sure your family learns as much as they can about your mental health. Having your family's support can help make your treatment successful. How to recognize changes in your condition Everyone has a different response to treatment for depression. Recovery from major depression happens when you have not had signs of major depression for two months. This may mean that you will start to:  Have more interest in doing activities.  Feel less hopeless than you did 2 months ago.  Have more energy.  Overeat less often, or have better or improving appetite.  Have better concentration. Your health care provider will work with you to decide the next steps in your recovery. It is also important to recognize when your condition is getting worse. Watch for these signs:  Having fatigue or low energy.  Eating too much or too little.  Sleeping too much or too little.  Feeling restless, agitated, or hopeless.  Having trouble concentrating or making decisions.  Having unexplained physical complaints.  Feeling irritable, angry, or aggressive. Get help as soon as you or your family members notice these symptoms coming back. How to get support and help from others How to talk with friends and family members about your condition  Talking to friends and family members about your condition can provide you with one way to get support and guidance. Reach out to trusted friends or family members, explain your symptoms to them, and let them know that you are working with a health care provider to treat your depression. Financial resources Not all insurance plans cover mental health care, so it is important to check with your insurance carrier. If paying for co-pays or counseling services is a problem, search for a local or county mental health care center. They may be able to offer public mental health care services  at low or no cost when you are not able to see a private health care provider. If you are taking medicine for depression, you may be able to get the generic form, which may be less expensive. Some makers of prescription medicines also offer help to patients who cannot afford the medicines they need. Follow these instructions at home:   Get the right amount and quality of sleep.  Cut down on using caffeine, tobacco, alcohol, and other potentially harmful substances.  Try to exercise, such as walking or lifting small weights.  Take over-the-counter and prescription medicines only as told by your health care provider.  Eat a healthy diet that includes plenty of vegetables, fruits, whole grains, low-fat dairy products, and lean protein. Do not eat a lot of foods that are high in solid fats, added sugars, or salt.  Keep all follow-up visits as told by your health care provider. This is important. Contact a health care provider if:  You stop taking your antidepressant medicines, and you have any of these symptoms: ? Nausea. ? Headache. ? Feeling lightheaded. ? Chills and body aches. ? Not being able to sleep (insomnia).  You or your friends and family think your depression is getting worse. Get help right away if:  You have thoughts of hurting yourself or others. If you ever feel like you may hurt yourself or others, or have thoughts about taking your own life, get help right away. You can go to your nearest emergency department or call:  Your local emergency services (911 in the [place]  A suicide crisis helpline, such as the [facility] at 8478265419. This is open 24-hours a day. Summary  If you are living with depression, there are ways to help you recover from it and also ways to prevent it from coming back.  Work with your health care team to create a management plan that includes counseling, stress management techniques, and healthy lifestyle  habits. This information is not intended to replace advice given to you by your health care provider. Make sure you discuss any questions you have with your health care provider. Document Revised: 06/11/2018 Document Reviewed: 01/21/2016 Elsevier Patient Education  [place].

## 2020-02-08 NOTE — Progress Notes (Signed)
Subjective:    Patient ID: Mardene Speak, male    DOB: 01/29/1991, 29 y.o.   MRN: [id number]  No chief complaint on file.   HPI Patient was seen today for acute concern.  Pt endorses loss of appetite and insomnia x 1 wk.  Pt states symptoms occurred after he had a viral illness.  Pt will try to eat a little something, but does not really feel hungry.  Pt notes he may sleep for a few hours, then will be up.  Pt notes working various shifts at work to fill in when needed.  Pt is now a Freight forwarder.  Endorses increased stress at home and work.  Pt was trying to buy a house.  Pt inquires about a referral to see if he has dyslexia.  Pt notes h/o difficulty in school, but never had any testing.  Past Medical History:  Diagnosis Date  . Hay fever     No Known Allergies  ROS General: Denies fever, chills, night sweats, changes in weight, changes in appetite, fatigue, and insomnia HEENT: Denies headaches, ear pain, changes in vision, rhinorrhea, sore throat CV: Denies CP, palpitations, SOB, orthopnea Pulm: Denies SOB, cough, wheezing GI: Denies abdominal pain, nausea, vomiting, diarrhea, constipation GU: Denies dysuria, hematuria, frequency.   Msk: Denies muscle cramps, joint pains Neuro: Denies weakness, numbness, tingling Skin: Denies rashes, bruising Psych: Denies depression, anxiety, hallucinations  +learning concern.     Objective:    Blood pressure 104/60, pulse 73, temperature 98.3 F (36.8 C), temperature source Oral, weight 187 lb 9.6 oz (85.1 kg), SpO2 99 %.   Gen. Pleasant, well-nourished, in no distress, normal affect  HEENT: [place]/AT, face symmetric, conjunctiva clear, no scleral icterus, PERRLA, EOMI, nares patent without drainage. Neck: No JVD, no thyromegaly Lungs: no accessory muscle use, CTAB, no wheezes or rales Cardiovascular: RRR, no m/r/g, no peripheral edema Musculoskeletal: No deformities, no cyanosis or clubbing, normal tone Neuro:  A&Ox3, CN II-XII intact,  normal gait Skin:  Warm, no lesions/ rash   Wt Readings from Last 3 Encounters:  02/08/20 187 lb 9.6 oz (85.1 kg)  05/14/18 179 lb (81.2 kg)  11/26/17 166 lb 9.6 oz (75.6 kg)    No results found for: WBC, HGB, HCT, PLT, GLUCOSE, CHOL, TRIG, HDL, LDLDIRECT, LDLCALC, ALT, AST, NA, K, CL, CREATININE, BUN, CO2, TSH, PSA, INR, GLUF, HGBA1C, MICROALBUR  Assessment/Plan:  Fatigue, unspecified type  -discussed possible causes -will obtain labs -pt advised to exercise as well as do other lifestyle modifications - Plan: CBC with Differential/Platelet, CMP with eGFR([facility]), Vitamin D, 25-hydroxy, Hemoglobin A1c, Vitamin B12  Primary insomnia  -Discussed sleep hygiene -Given handout - Plan: TSH, T4, free  Decreased appetite  -possible 2/2 stress, anxiety -Obtain labs and PHQ-9/GAD-7 -consider f/u with GI for worsened or continued symptoms. - Plan: CBC with Differential/Platelet, CMP with eGFR([facility])  Anxiety and depression  -PHQ-9 score 8 -GAD-7 score 14 -discussed counseling and medication options.  Pt considering -continue to monitor -given precautions - Plan: TSH, T4, free  Impaired reading comprehension -discussed looking for area psychology offices that provide adult reading comprehension/learning deficit testing -likely does not need a referral. -will have pt complete [facility] at next [facility]. -contacted neuropsychiatric care center for suggestions on area providers that offer adult testing/screening, however awaiting return call.  F/u prn in 1 month  Grier Mitts, MD

## 2020-02-09 LAB — COMPLETE METABOLIC PANEL WITH GFR
AG Ratio: 1.6 (calc) (ref 1.0–2.5)
ALT: 36 U/L (ref 9–46)
AST: 16 U/L (ref 10–40)
Albumin: 4.5 g/dL (ref 3.6–5.1)
Alkaline phosphatase (APISO): 58 U/L (ref 36–130)
BUN: 13 mg/dL (ref 7–25)
CO2: 31 mmol/L (ref 20–32)
Calcium: 9.5 mg/dL (ref 8.6–10.3)
Chloride: 103 mmol/L (ref 98–110)
Creat: 1.08 mg/dL (ref 0.60–1.35)
GFR, Est African American: 105 mL/min/{1.73_m2} (ref >=60)
GFR, Est Non African American: 91 mL/min/{1.73_m2} (ref >=60)
Globulin: 2.8 g/dL (calc) (ref 1.9–3.7)
Glucose, Bld: 107 mg/dL — ABNORMAL HIGH (ref 65–99)
Potassium: 4.4 mmol/L (ref 3.5–5.3)
Sodium: 140 mmol/L (ref 135–146)
Total Bilirubin: 0.8 mg/dL (ref 0.2–1.2)
Total Protein: 7.3 g/dL (ref 6.1–8.1)

## 2020-02-09 LAB — VITAMIN D 25 HYDROXY (VIT D DEFICIENCY, FRACTURES): Vit D, 25-Hydroxy: 14 ng/mL — ABNORMAL LOW (ref 30–100)

## 2020-02-09 LAB — CBC WITH DIFFERENTIAL/PLATELET
Absolute Monocytes: 365 cells/uL (ref 200–950)
Basophils Absolute: 22 cells/uL (ref 0–200)
Basophils Relative: 0.5 %
Eosinophils Absolute: 31 cells/uL (ref 15–500)
Eosinophils Relative: 0.7 %
HCT: 40.2 % (ref 38.5–50.0)
Hemoglobin: 13.7 g/dL (ref 13.2–17.1)
Lymphs Abs: 1764 cells/uL (ref 850–3900)
MCH: 29.7 pg (ref 27.0–33.0)
MCHC: 34.1 g/dL (ref 32.0–36.0)
MCV: 87 fL (ref 80.0–100.0)
MPV: 10.2 fL (ref 7.5–12.5)
Monocytes Relative: 8.3 %
Neutro Abs: 2218 cells/uL (ref 1500–7800)
Neutrophils Relative %: 50.4 %
Platelets: 244 10*3/uL (ref 140–400)
RBC: 4.62 10*6/uL (ref 4.20–5.80)
RDW: 13.1 % (ref 11.0–15.0)
Total Lymphocyte: 40.1 %
WBC: 4.4 10*3/uL (ref 3.8–10.8)

## 2020-02-09 LAB — TSH: TSH: 1.01 mIU/L (ref 0.40–4.50)

## 2020-02-09 LAB — T4, FREE: Free T4: 1.2 ng/dL (ref 0.8–1.8)

## 2020-02-09 LAB — HEMOGLOBIN A1C
Hgb A1c MFr Bld: 5 % of total Hgb (ref <5.7)
Mean Plasma Glucose: 97 mg/dL
eAG (mmol/L): 5.4 mmol/L

## 2020-02-09 LAB — VITAMIN B12: Vitamin B-12: 544 pg/mL (ref 200–1100)

## 2020-02-10 ENCOUNTER — Encounter: Payer: Self-pay | Admitting: Family Medicine

## 2020-02-13 ENCOUNTER — Other Ambulatory Visit: Payer: Self-pay | Admitting: Family Medicine

## 2020-02-13 DIAGNOSIS — E559 Vitamin D deficiency, unspecified: Secondary | ICD-10-CM

## 2020-02-13 MED ORDER — VITAMIN D (ERGOCALCIFEROL) 1.25 MG (50000 UNIT) PO CAPS: 50000.0000 [IU] | ORAL_CAPSULE | ORAL | 0 refills | Status: DC

## 2020-02-14 NOTE — Telephone Encounter (Signed)
Pt is calling in to check the status of a referral that he and Dr. Salomon Fick had talked about and was unable to give any information to him due to not seeing anything in his chart.  Pt would like to have a call back.

## 2020-02-17 NOTE — Telephone Encounter (Signed)
Pt is calling in to check the status of his msg.  Pt is aware that it is in the providers desktop and they will give him a call back.

## 2020-02-23 ENCOUNTER — Encounter: Payer: Self-pay | Admitting: Family Medicine

## 2020-04-13 ENCOUNTER — Telehealth (INDEPENDENT_AMBULATORY_CARE_PROVIDER_SITE_OTHER): Payer: Self-pay | Admitting: Family Medicine

## 2020-04-13 DIAGNOSIS — R21 Rash and other nonspecific skin eruption: Secondary | ICD-10-CM

## 2020-04-13 NOTE — Patient Instructions (Signed)
-  hydrocortisone cream twice daily on the rash for 1 week  -allegra once daily for 1 week   I hope you are feeling better soon!  Seek in person care promptly if your symptoms worsen, new concerns arise or you are not improving with treatment.  It was nice to meet you today. I help  out with telemedicine visits on Tuesdays and Thursdays and am available for visits on those days. If you have any concerns or questions following this visit please schedule a follow up visit with your Primary Care doctor or seek care at a local urgent care clinic to avoid delays in care.

## 2020-04-13 NOTE — Progress Notes (Signed)
Virtual Visit via Video Note  I connected with Jose Dean  on 04/13/20 at  3:00 PM EST by a video enabled telemedicine application and verified that I am speaking with the correct person using two identifiers.  Location patient: home, [place] Location provider:work or home office Persons participating in the virtual visit: patient, provider  I discussed the limitations of evaluation and management by telemedicine and the availability of in person appointments. The patient expressed understanding and agreed to proceed.   HPI:  Acute telemedicine visit for Rash: -Onset: 7-10 days ago -Symptoms include: itchy rash on his stomach below his umbilicus -Denies: spreading, drainage, pain, rash elsewhere, new exposures that he can think of -Has been scratching it  ROS: See pertinent positives and negatives per HPI.  Past Medical History:  Diagnosis Date  . Hay fever     No past surgical history on file.   Current Outpatient Medications:  .  TESTOSTERONE CYPIONATE IJ, Inject as directed once a week., Disp: , Rfl:  .  Vitamin D, Ergocalciferol, (DRISDOL) 1.25 MG (50000 UNIT) CAPS capsule, Take 1 capsule (50,000 Units total) by mouth every 7 (seven) days., Disp: 12 capsule, Rfl: 0  EXAM:  VITALS per patient if applicable:  GENERAL: alert, oriented, appears well and in no acute distress  HEENT: atraumatic, conjunttiva clear, no obvious abnormalities on inspection of external nose and ears  NECK: normal movements of the head and neck  LUNGS: on inspection no signs of respiratory distress, breathing rate appears normal, no obvious gross SOB, gasping or wheezing  CV: no obvious cyanosis  MS: moves all visible extremities without noticeable abnormality  SKIN: video visit exam limited due to poor video quality - appears to have small area of hyperpigmented papules with several excoriations below the umbilicus.  PSYCH/NEURO: pleasant and cooperative, no obvious depression or anxiety, speech  and thought processing grossly intact  ASSESSMENT AND PLAN:  Discussed the following assessment and plan:  Rash  -we discussed possible serious and likely etiologies, options for evaluation and workup, limitations of telemedicine visit vs in person visit, treatment, treatment risks and precautions. Pt prefers to treat via telemedicine empirically rather than in person at this moment. Query reaction to insect bite, contact dermatitis versus other. Opted to try trial of topical hydrocortisone twice daily along with Allegra for the itching. Did advise will need an in person evaluation if any worsening, new symptoms arise or is not improving with this regimen over the next several days. Advised to try to refrain from scratching. Scheduled follow up with PCP offered: He agrees to call PCP office for follow-up visit if needed. Advised to seek prompt in person care if worsening, new symptoms arise, or if is not improving with treatment. Did let this patient know that I only do telemedicine on Tuesdays and Thursdays for [place]. Advised to schedule follow up visit with PCP or [facility] if any further questions or concerns to avoid delays in care.   I discussed the assessment and treatment plan with the patient. The patient was provided an opportunity to ask questions and all were answered. The patient agreed with the plan and demonstrated an understanding of the instructions.     Terressa Koyanagi, DO

## 2020-06-07 ENCOUNTER — Other Ambulatory Visit: Payer: Self-pay

## 2020-06-07 ENCOUNTER — Ambulatory Visit (HOSPITAL_COMMUNITY)
Admission: EM | Admit: 2020-06-07 | Discharge: 2020-06-07 | Disposition: A | Discharge status: Home / Self Care | Payer: Self-pay | Attending: Urgent Care | Admitting: Urgent Care

## 2020-06-07 ENCOUNTER — Encounter (HOSPITAL_COMMUNITY): Payer: Self-pay | Admitting: *Deleted

## 2020-06-07 DIAGNOSIS — W540XXA Bitten by dog, initial encounter: Secondary | ICD-10-CM

## 2020-06-07 DIAGNOSIS — Z23 Encounter for immunization: Secondary | ICD-10-CM

## 2020-06-07 DIAGNOSIS — M79641 Pain in right hand: Secondary | ICD-10-CM

## 2020-06-07 MED ORDER — AMOXICILLIN-POT CLAVULANATE 875-125 MG PO TABS: 1.0000 | ORAL_TABLET | Freq: Two times a day (BID) | ORAL | 0 refills | Status: DC

## 2020-06-07 MED ORDER — TETANUS-DIPHTH-ACELL PERTUSSIS 5-2.5-18.5 LF-MCG/0.5 IM SUSY
0.5000 mL | PREFILLED_SYRINGE | Freq: Once | INTRAMUSCULAR | Status: AC
  Administered 2020-06-07: 0.5 mL via INTRAMUSCULAR

## 2020-06-07 MED ORDER — NAPROXEN 500 MG PO TABS: 500.0000 mg | ORAL_TABLET | Freq: Two times a day (BID) | ORAL | 0 refills | Status: DC

## 2020-06-07 MED ORDER — TETANUS-DIPHTH-ACELL PERTUSSIS 5-2.5-18.5 LF-MCG/0.5 IM SUSY
PREFILLED_SYRINGE | INTRAMUSCULAR | Status: AC
  Filled 2020-06-07: qty 0.5

## 2020-06-07 NOTE — ED Triage Notes (Signed)
Pt reports dog bite today. Dog is up to date on rabies .

## 2020-06-07 NOTE — Discharge Instructions (Addendum)
Please change your dressing 3-5 times daily. Do not apply any ointments or creams. Each time you change your dressing, make sure you clean the wound gently soap and warm water. Pat your wound dry and let it air out if possible for 1-2 hours before reapplying another dressing.

## 2020-06-07 NOTE — ED Provider Notes (Signed)
  [facility]   MRN: [id number] DOB: 07/07/1990  Subjective:   Jose Dean is a 30 y.o. male presenting for suffering a dog bite today to the right hand.  Patient states that it was his friend's dog, is completely vaccinated.  States that he cleaned out his wound thoroughly and came straight to our clinic.  Cannot recall when his last Tdap was.  Denies drainage of pus from the wound.  No current facility-administered medications for this encounter.  Current Outpatient Medications:  .  TESTOSTERONE CYPIONATE IJ, Inject as directed once a week., Disp: , Rfl:  .  Vitamin D, Ergocalciferol, (DRISDOL) 1.25 MG (50000 UNIT) CAPS capsule, Take 1 capsule (50,000 Units total) by mouth every 7 (seven) days., Disp: 12 capsule, Rfl: 0   No Known Allergies  Past Medical History:  Diagnosis Date  . Hay fever      History reviewed. No pertinent surgical history.  History reviewed. No pertinent family history.  Social History   Tobacco Use  . Smoking status: Former Games developer  . Smokeless tobacco: Never Used  Substance Use Topics  . Alcohol use: Yes  . Drug use: No    ROS   Objective:   Vitals: Pulse 68   Temp 98 F (36.7 C) (Oral)   Resp 18   SpO2 98%   Physical Exam Constitutional:      General: He is not in acute distress.    Appearance: Normal appearance. He is well-developed and normal weight. He is not ill-appearing, toxic-appearing or diaphoretic.  HENT:     Head: Normocephalic and atraumatic.     Right Ear: External ear normal.     Left Ear: External ear normal.     Nose: Nose normal.     Mouth/Throat:     Pharynx: Oropharynx is clear.  Eyes:     General: No scleral icterus.       Right eye: No discharge.        Left eye: No discharge.     Extraocular Movements: Extraocular movements intact.     Pupils: Pupils are equal, round, and reactive to light.  Cardiovascular:     Rate and Rhythm: Normal rate.  Pulmonary:     Effort: Pulmonary  effort is normal.  Musculoskeletal:       Hands:     Cervical back: Normal range of motion.  Neurological:     Mental Status: He is alert and oriented to person, place, and time.  Psychiatric:        Mood and Affect: Mood normal.        Behavior: Behavior normal.        Thought Content: Thought content normal.        Judgment: Judgment normal.         Assessment and Plan :   PDMP not reviewed this encounter.  1. Dog bite, initial encounter   2. Right hand pain   3. Need for diphtheria-tetanus-pertussis (Tdap) vaccine     Start Augmentin given the puncture wound to the hand.  Use naproxen for pain control.  Counseled on general wound care.  Tdap updated in clinic today. Counseled patient on potential for adverse effects with medications prescribed/recommended today, ER and return-to-clinic precautions discussed, patient verbalized understanding.    Wallis Bamberg, [place] 06/07/20 1842

## 2020-08-30 ENCOUNTER — Ambulatory Visit: Payer: Self-pay | Admitting: Family Medicine

## 2020-08-30 DIAGNOSIS — Z0289 Encounter for other administrative examinations: Secondary | ICD-10-CM

## 2020-10-08 NOTE — Progress Notes (Signed)
MyChart Video Visit  Virtual Visit via Video Note   This visit type was conducted due to national recommendations for restrictions regarding the COVID-19 Pandemic (e.g. social distancing) in an effort to limit this patient's exposure and mitigate transmission in our community. This patient is at least at moderate risk for complications without adequate follow up. This format is felt to be most appropriate for this patient at this time. Physical exam was limited by quality of the video and audio technology used for the visit.   Patient location: Outdoors. Provider location: Office.  I discussed the limitations of evaluation and management by telemedicine and the availability of in person appointments. The patient expressed understanding and agreed to proceed.  Patient: Jose Dean   DOB: 1990-04-16   30 y.o. Male  MRN: [id number] Visit Date: 10/09/2020  Today's healthcare provider: Betty Swaziland, MD   Chief Complaint  Patient presents with   Sleeping Problem   Jose Dean is a 30 y.o.male with hx of vitamin D deficiency, ED, and seasonal allergies with above complaint. He drives trucks for living and states that he has difficulty staying awake for long hours, he is requesting medication to help him to stay awake. He is asking for Adderall or Modafinil.  Because his job, sometimes he has to sleep in the truck while he is on the road, he does not know how many hours he sleeps in average, states that "it depends." Not sure about how many hours he sleeps when he is home. No known hx of OSA or narcolepsy.  States that he has done "a lot of research" and states that these type of medications are taken by managers and executive to keep them "focus and alert" for long work hours.    There are no problems to display for this patient.  Past Medical History:  Diagnosis Date   Hay fever    Social History   Tobacco Use   Smoking status: Former   Smokeless tobacco:  Never  Substance Use Topics   Alcohol use: Yes   Drug use: No   No Known Allergies  Medications: Outpatient Medications Prior to Visit  Medication Sig   naproxen (NAPROSYN) 500 MG tablet Take 1 tablet (500 mg total) by mouth 2 (two) times daily with a meal.   TESTOSTERONE CYPIONATE IJ Inject as directed once a week.   Vitamin D, Ergocalciferol, (DRISDOL) 1.25 MG (50000 UNIT) CAPS capsule Take 1 capsule (50,000 Units total) by mouth every 7 (seven) days.   [DISCONTINUED] amoxicillin-clavulanate (AUGMENTIN) 875-125 MG tablet Take 1 tablet by mouth every 12 (twelve) hours.   No facility-administered medications prior to visit.   Review of Systems  Constitutional:  Positive for fatigue. Negative for appetite change, fever and unexpected weight change.  Respiratory:  Negative for cough, shortness of breath and wheezing.   Cardiovascular:  Negative for chest pain and palpitations.  Endocrine: Negative for cold intolerance and heat intolerance.  Neurological:  Negative for tremors, syncope and weakness.  Psychiatric/Behavioral:  Negative for confusion and hallucinations.   See pertinent positives and negatives per HPI.  Objective    Ht 5\' 8"  (1.727 m)   BMI 28.52 kg/m   GENERAL: alert, oriented, appears well and in no acute distress  HEENT: atraumatic, conjunctiva clear, no obvious abnormalities on inspection of external nose and ears  LUNGS: on inspection no signs of respiratory distress, breathing rate appears normal, no obvious gross SOB, gasping or wheezing  CV: no obvious cyanosis  PSYCH/NEURO: Cooperative, no obvious depression or anxiety, speech and thought processing grossly intact   Assessment & Plan    1. Hypersomnolence We discussed possible etiologies, not having enough sleep is the main one to consider and therefore treat. Explained that medications he is requesting are indicated for specific conditions and I do not think it is appropriate to take neither one at  this time. Because sleep deprivation can increase the risk of accidents, strongly recommend getting adequate sleep, 6-7 hours/day. I offered referral to sleep specialist/sleep study, he is not interested. He is not satisfied with visit and asked to see a different provider. I recommend arranging appt with PCP to address his concerns.  During video visit we lost connection, so I called him and finished visit by phone (7 min/50%).  Return if symptoms worsen or fail to improve.    I discussed the assessment and treatment plan with the patient. The patient was provided an opportunity to ask questions and all were answered. The patient does not agreed with the plan but demonstrated an understanding of the instructions.  Betty Swaziland, MD [facility] [place] [phone] (phone) [phone] (fax)  [facility]

## 2020-10-09 ENCOUNTER — Telehealth (INDEPENDENT_AMBULATORY_CARE_PROVIDER_SITE_OTHER): Payer: Self-pay | Admitting: Family Medicine

## 2020-10-09 ENCOUNTER — Encounter: Payer: Self-pay | Admitting: Family Medicine

## 2020-10-09 VITALS — Ht 68.0 in

## 2020-10-09 DIAGNOSIS — G4710 Hypersomnia, unspecified: Secondary | ICD-10-CM

## 2020-10-17 ENCOUNTER — Encounter: Payer: Self-pay | Admitting: Family Medicine

## 2020-10-17 ENCOUNTER — Telehealth: Payer: Self-pay | Admitting: Family Medicine

## 2020-10-25 ENCOUNTER — Encounter: Payer: Self-pay | Admitting: Family Medicine

## 2020-10-25 NOTE — Progress Notes (Signed)
Unable to complete visit/connect with [place].

## 2020-12-05 ENCOUNTER — Ambulatory Visit: Payer: Self-pay | Admitting: Family Medicine

## 2021-06-13 ENCOUNTER — Ambulatory Visit (INDEPENDENT_AMBULATORY_CARE_PROVIDER_SITE_OTHER): Payer: Self-pay | Admitting: Family Medicine

## 2021-06-13 ENCOUNTER — Encounter: Payer: Self-pay | Admitting: Family Medicine

## 2021-06-13 ENCOUNTER — Telehealth: Payer: Self-pay | Admitting: Family Medicine

## 2021-06-13 VITALS — BP 134/74 | HR 72 | Temp 98.7°F | Wt 186.8 lb

## 2021-06-13 DIAGNOSIS — L309 Dermatitis, unspecified: Secondary | ICD-10-CM

## 2021-06-13 DIAGNOSIS — L729 Follicular cyst of the skin and subcutaneous tissue, unspecified: Secondary | ICD-10-CM

## 2021-06-13 DIAGNOSIS — G4726 Circadian rhythm sleep disorder, shift work type: Secondary | ICD-10-CM

## 2021-06-13 LAB — COMPREHENSIVE METABOLIC PANEL
ALT: 42 U/L (ref 0–53)
AST: 23 U/L (ref 0–37)
Albumin: 4.8 g/dL (ref 3.5–5.2)
Alkaline Phosphatase: 60 U/L (ref 39–117)
BUN: 11 mg/dL (ref 6–23)
CO2: 29 mEq/L (ref 19–32)
Calcium: 9.6 mg/dL (ref 8.4–10.5)
Chloride: 103 mEq/L (ref 96–112)
Creatinine, Ser: 1.07 mg/dL (ref 0.40–1.50)
GFR: 91.62 mL/min (ref >60.00)
Glucose, Bld: 99 mg/dL (ref 70–99)
Potassium: 4.1 mEq/L (ref 3.5–5.1)
Sodium: 138 mEq/L (ref 135–145)
Total Bilirubin: 0.9 mg/dL (ref 0.2–1.2)
Total Protein: 7.6 g/dL (ref 6.0–8.3)

## 2021-06-13 LAB — CBC WITH DIFFERENTIAL/PLATELET
Basophils Absolute: 0 10*3/uL (ref 0.0–0.1)
Basophils Relative: 0.4 % (ref 0.0–3.0)
Eosinophils Absolute: 0.1 10*3/uL (ref 0.0–0.7)
Eosinophils Relative: 1.9 % (ref 0.0–5.0)
HCT: 42.4 % (ref 39.0–52.0)
Hemoglobin: 14.7 g/dL (ref 13.0–17.0)
Lymphocytes Relative: 32.3 % (ref 12.0–46.0)
Lymphs Abs: 1.7 10*3/uL (ref 0.7–4.0)
MCHC: 34.6 g/dL (ref 30.0–36.0)
MCV: 86.5 fl (ref 78.0–100.0)
Monocytes Absolute: 0.4 10*3/uL (ref 0.1–1.0)
Monocytes Relative: 7.5 % (ref 3.0–12.0)
Neutro Abs: 3 10*3/uL (ref 1.4–7.7)
Neutrophils Relative %: 57.9 % (ref 43.0–77.0)
Platelets: 214 10*3/uL (ref 150.0–400.0)
RBC: 4.9 Mil/uL (ref 4.22–5.81)
RDW: 13 % (ref 11.5–15.5)
WBC: 5.2 10*3/uL (ref 4.0–10.5)

## 2021-06-13 LAB — T4, FREE: Free T4: 0.91 ng/dL (ref 0.60–1.60)

## 2021-06-13 LAB — TSH: TSH: 1.14 u[IU]/mL (ref 0.35–5.50)

## 2021-06-13 MED ORDER — TRIAMCINOLONE ACETONIDE 0.1 % EX CREA: 1.0000 "application " | TOPICAL_CREAM | Freq: Two times a day (BID) | CUTANEOUS | 0 refills | Status: DC

## 2021-06-13 MED ORDER — LORATADINE 10 MG PO TABS: 10.0000 mg | ORAL_TABLET | Freq: Every day | ORAL | 0 refills | Status: DC

## 2021-06-13 MED ORDER — MODAFINIL 100 MG PO TABS: ORAL_TABLET | ORAL | 0 refills | Status: DC

## 2021-06-13 NOTE — Progress Notes (Signed)
Subjective:  ? ? Patient ID: Jose Dean, male    DOB: [birth year], 31 y.o.   MRN: [id number] ? ?Chief Complaint  ?Patient presents with  ? Rash  ?  Rash on neck, a couple weeks ago,right side under shin. Dry and red, itching really bad. Tried some cream, did not help.   ? ? ?HPI ?Patient was seen today for acute concern.  Patient endorses pruritic rash on the right side of neck times several weeks.  Rash is improving. Denies changes in soaps, lotions, detergents.  Patient also mentions sore/cuts seems to take longer to heal.  Patient endorses scratching skin of neck and chest prior to falling asleep.  Patient also has long hair that he often hangs by side of his neck.  Patient has hair professionally washed every 2 months and does not use products in it.  Patient tried OTC cortisone cream that helps some.  Patient also notes bump in midline upper chest present times "a while" that will not go away. ? 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  Patient endorses difficulty staying awake during his day.  Drinks coffee but it does not help.  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 ? ?Past Medical History:  ?Diagnosis Date  ? Hay fever   ? ? ?No Known Allergies ? ?ROS ?General: Denies fever, chills, night sweats, changes in weight, changes in appetite ?HEENT: Denies headaches, ear pain, changes in vision, rhinorrhea, sore throat ?CV: Denies CP, palpitations, SOB, orthopnea ?Pulm: Denies SOB, cough, wheezing ?GI: Denies abdominal pain, nausea, vomiting, diarrhea, constipation ?GU: Denies dysuria, hematuria, frequency ?Msk: Denies muscle cramps, joint pains ?Neuro: Denies weakness, numbness, tingling ?Skin: Denies rashes, bruising  +rash, pruritis ?Psych: Denies depression, anxiety, hallucinations  +difficulty staying awake at work ?   ?Objective:  ?  ?Blood pressure 134/74, pulse 72, temperature 98.7 ?F  (37.1 ?C), temperature source Oral, weight 186 lb 12.8 oz (84.7 kg), SpO2 99 %. ? ?Gen. Pleasant, well-nourished, in no distress, normal affect   ?HEENT: [place]/AT, face symmetric, conjunctiva clear, no scleral icterus, PERRLA, EOMI, nares patent without drainage ?Lungs: no accessory muscle use, CTAB, no wheezes or rales ?Cardiovascular: RRR, no m/r/g, no peripheral edema ?Musculoskeletal: No deformities, no cyanosis or clubbing, normal tone ?Neuro:  A&Ox3, CN II-XII intact, normal gait ?Skin:  Warm, dry, intact.  Patient with hyperpigmentation fine papules in a plaque on right lateral neck.  Hyperpigmentation, excoriation, cystic-like from area with slight fluctuance of upper chest midline ? ? ?Wt Readings from Last 3 Encounters:  ?06/13/21 186 lb 12.8 oz (84.7 kg)  ?02/08/20 187 lb 9.6 oz (85.1 kg)  ?05/14/18 179 lb (81.2 kg)  ? ? ?Lab Results  ?Component Value Date  ? WBC 4.4 02/08/2020  ? HGB 13.7 02/08/2020  ? HCT 40.2 02/08/2020  ? PLT 244 02/08/2020  ? GLUCOSE 107 (H) 02/08/2020  ? ALT 36 02/08/2020  ? AST 16 02/08/2020  ? NA 140 02/08/2020  ? K 4.4 02/08/2020  ? CL 103 02/08/2020  ? CREATININE 1.08 02/08/2020  ? BUN 13 02/08/2020  ? CO2 31 02/08/2020  ? TSH 1.01 02/08/2020  ? HGBA1C 5.0 02/08/2020  ? ? ?Assessment/Plan: ? ?Dermatitis ?-New problem ?-Discussed possible causes including atopic dermatitis, seasonal allergies, contact dermatitis from patient's hair ? - Plan: loratadine (CLARITIN) 10 MG tablet, triamcinolone cream (KENALOG) 0.1 %, CMP, CBC with Differential/Platelet ? ?  Shift work sleep disorder ?-New problem ?-Discussed sleep hygiene ?-We will obtain labs ?-Patient advised will likely continue having difficulty sleeping while working third shift in his second job. ?-will start a trial modafinil ?- Plan: CMP, CBC with Differential/Platelet, TSH, T4, Free, modafinil (PROVIGIL) 100 MG tablet ? ?Cyst of skin ?-New problem ?-Midline upper chest ?-Discussed removal options ? ?F/u in 1 month ? ?Abbe Amsterdam, MD ?

## 2021-06-13 NOTE — Telephone Encounter (Signed)
Pt call and stated he want triamcinolone cream (KENALOG) 0.1 % sent to [facility] and not [facility]. ?

## 2021-06-13 NOTE — Patient Instructions (Signed)
You can use over-the-counter vitamin E oil to help heal dark spots on her skin.  A prescription for Claritin was sent to your pharmacy.  This is a allergy pill that you can take for itching as needed.  A prescription for triamcinolone cream was also sent in that you can use for the spot on your neck.  Prescription for modafinil was sent to the pharmacy.  You can try this 1 hour prior to starting her shift at 2 AM.  This medication may cause headaches, elevated blood pressure, decreased appetite, etc. ?

## 2021-06-14 ENCOUNTER — Telehealth: Payer: Self-pay | Admitting: Family Medicine

## 2021-06-14 MED ORDER — TRIAMCINOLONE ACETONIDE 0.1 % EX CREA: 1.0000 "application " | TOPICAL_CREAM | Freq: Two times a day (BID) | CUTANEOUS | 0 refills | Status: DC

## 2021-06-14 NOTE — Telephone Encounter (Signed)
Pt is calling checking on the status of the below medication ?

## 2021-06-14 NOTE — Telephone Encounter (Signed)
Pt called asking the status of his Rx Modafinil 100 MG being sent to [facility]. I let him know we sent the cream but he was also referring to the pills. He was asking for it to be sent before tomorrow.  ? ?Please advise. ?

## 2021-06-14 NOTE — Telephone Encounter (Signed)
2 wks supply for the cream. ?

## 2021-06-14 NOTE — Telephone Encounter (Signed)
Medication refilled to pharmacy requested. 

## 2021-06-14 NOTE — Telephone Encounter (Signed)
Lori from [employer] called and stated that starting 3 mths ago, INS is now requiring them to ask Physicians how long creams are suppose to last; for example is this a month supply, two week, etc?  ? ?Please advise.  ?

## 2021-06-14 NOTE — Telephone Encounter (Signed)
Pt is calling checking on status of medication that needs to be sent to [facility] on [address] ?

## 2021-06-14 NOTE — Telephone Encounter (Signed)
Last Ov 06/13/21 ?Please advise ? ?

## 2021-06-17 ENCOUNTER — Other Ambulatory Visit: Payer: Self-pay

## 2021-06-17 ENCOUNTER — Telehealth: Payer: Self-pay | Admitting: Family Medicine

## 2021-06-17 DIAGNOSIS — G4726 Circadian rhythm sleep disorder, shift work type: Secondary | ICD-10-CM

## 2021-06-17 MED ORDER — MODAFINIL 100 MG PO TABS: ORAL_TABLET | ORAL | 0 refills | Status: DC

## 2021-06-17 NOTE — Telephone Encounter (Signed)
Patient stopped by to follow up on prescription for Modafinil being sent. I let patient know that around 12:30, Dr.Banks sent a message stating that it had been sent and she will follow up to ensure it had been. Patient stated that he had spoken to pharmacy and they had not received anything. I called pharmacy to confirm, which pharmacy tech did confirm that they had not received anything. She asked if it was possible to get a verbal. I spoke with CMA and transferred tech over. ? ? ? ? ? ?FYI  ?

## 2021-06-17 NOTE — Telephone Encounter (Signed)
Admitted with recent to pt's other pharmacy.  Will check to make sure it was received. ?

## 2021-06-17 NOTE — Telephone Encounter (Signed)
Spoke with Victorino Dike from [facility], gave Rx verbally over the phone.  ?

## 2021-08-14 ENCOUNTER — Other Ambulatory Visit: Payer: Self-pay | Admitting: Family Medicine

## 2021-08-14 DIAGNOSIS — G4726 Circadian rhythm sleep disorder, shift work type: Secondary | ICD-10-CM

## 2021-08-16 ENCOUNTER — Telehealth: Payer: Self-pay | Admitting: Family Medicine

## 2021-08-16 NOTE — Telephone Encounter (Signed)
Pt requesting a refill of modafinil (PROVIGIL) 100 MG tablet  [id number] - [place], [place] - [address] Phone:  [phone]  Fax:  [phone]

## 2021-08-21 ENCOUNTER — Other Ambulatory Visit: Payer: Self-pay | Admitting: Family Medicine

## 2021-08-21 DIAGNOSIS — G4726 Circadian rhythm sleep disorder, shift work type: Secondary | ICD-10-CM

## 2021-08-21 NOTE — Telephone Encounter (Signed)
Pt needs to schedule f/u appt.  Limited refill sent.

## 2021-08-22 NOTE — Telephone Encounter (Signed)
Patient scheduled.

## 2021-08-29 ENCOUNTER — Ambulatory Visit: Payer: Self-pay | Admitting: Family Medicine

## 2021-09-02 ENCOUNTER — Ambulatory Visit (INDEPENDENT_AMBULATORY_CARE_PROVIDER_SITE_OTHER): Payer: Self-pay | Admitting: Family Medicine

## 2021-09-02 ENCOUNTER — Encounter: Payer: Self-pay | Admitting: Family Medicine

## 2021-09-02 DIAGNOSIS — G4726 Circadian rhythm sleep disorder, shift work type: Secondary | ICD-10-CM

## 2021-09-02 MED ORDER — MODAFINIL 200 MG PO TABS: 200.0000 mg | ORAL_TABLET | Freq: Every day | ORAL | 0 refills | Status: DC

## 2021-09-02 NOTE — Progress Notes (Signed)
Subjective:    Patient ID: Jose Dean, male    DOB: 1990/12/14, 31 y.o.   MRN: [id number]  Chief Complaint  Patient presents with   Medication Refill    HPI Patient was seen today for f/u on shiftwork or sleep disorder.  Patient notes improvement in daytime somnolence since taking Provigil.  Pt is a Naval architect.  States 100 mg dose helps some but was not enough so he increased dose to 200 mg.  Patient states it does not cause insomnia at night, decreased appetite, dry mouth, change in mood.  Patient does not have to take medication every day.  Past Medical History:  Diagnosis Date   Hay fever     No Known Allergies  ROS General: Denies fever, chills, night sweats, changes in weight, changes in appetite HEENT: Denies headaches, ear pain, changes in vision, rhinorrhea, sore throat CV: Denies CP, palpitations, SOB, orthopnea Pulm: Denies SOB, cough, wheezing GI: Denies abdominal pain, nausea, vomiting, diarrhea, constipation GU: Denies dysuria, hematuria, frequency Msk: Denies muscle cramps, joint pains Neuro: Denies weakness, numbness, tingling Skin: Denies rashes, bruising Psych: Denies depression, anxiety, hallucinations     Objective:    Blood pressure 108/76, pulse 63, temperature 98.4 F (36.9 C), temperature source Oral, weight 183 lb 9.6 oz (83.3 kg), SpO2 97 %.  Gen. Pleasant, well-nourished, in no distress, normal affect   HEENT: [place]/AT, face symmetric, conjunctiva clear, no scleral icterus, PERRLA, EOMI, nares patent without drainage Lungs: no accessory muscle use, CTAB, no wheezes or rales Cardiovascular: RRR, no m/r/g, no peripheral edema Musculoskeletal: No deformities, no cyanosis or clubbing, normal tone Neuro:  A&Ox3, CN II-XII intact, normal gait Skin:  Warm, no lesions/ rash  Wt Readings from Last 3 Encounters:  09/02/21 183 lb 9.6 oz (83.3 kg)  06/13/21 186 lb 12.8 oz (84.7 kg)  02/08/20 187 lb 9.6 oz (85.1 kg)    Lab Results  Component  Value Date   WBC 5.2 06/13/2021   HGB 14.7 06/13/2021   HCT 42.4 06/13/2021   PLT 214.0 06/13/2021   GLUCOSE 99 06/13/2021   ALT 42 06/13/2021   AST 23 06/13/2021   NA 138 06/13/2021   K 4.1 06/13/2021   CL 103 06/13/2021   CREATININE 1.07 06/13/2021   BUN 11 06/13/2021   CO2 29 06/13/2021   TSH 1.14 06/13/2021   HGBA1C 5.0 02/08/2020    Assessment/Plan:  Shift work sleep disorder  -Improvement noted in somnolence since starting Provigil. -We will increase dose from 100 to 200 mg for better control. -4 pound weight loss noted.  Patient advised to eat regular meals and monitor weight closely. -Rx x 3 months sent to [facility] pharmacy. - Plan: modafinil (PROVIGIL) 200 MG tablet, modafinil (PROVIGIL) 200 MG tablet, modafinil (PROVIGIL) 200 MG tablet  F/u in 3 months  Abbe Amsterdam, MD

## 2022-03-07 ENCOUNTER — Other Ambulatory Visit: Payer: Self-pay | Admitting: Family Medicine

## 2022-03-07 DIAGNOSIS — G4726 Circadian rhythm sleep disorder, shift work type: Secondary | ICD-10-CM

## 2022-03-13 ENCOUNTER — Ambulatory Visit (INDEPENDENT_AMBULATORY_CARE_PROVIDER_SITE_OTHER): Payer: Self-pay | Admitting: Family Medicine

## 2022-03-13 ENCOUNTER — Encounter: Payer: Self-pay | Admitting: Family Medicine

## 2022-03-13 VITALS — BP 126/86 | HR 58 | Temp 98.1°F | Wt 174.6 lb

## 2022-03-13 DIAGNOSIS — G4726 Circadian rhythm sleep disorder, shift work type: Secondary | ICD-10-CM | POA: Insufficient documentation

## 2022-03-13 MED ORDER — MODAFINIL 200 MG PO TABS: 200.0000 mg | ORAL_TABLET | Freq: Every day | ORAL | 0 refills | Status: DC

## 2022-03-13 NOTE — Progress Notes (Signed)
   Established Patient Office Visit  Subjective   Patient ID: Jose Dean, male    DOB: 1990-12-08  Age: 32 y.o. MRN: [id number]  No chief complaint on file.   HPI Patient is a 32 year old man seen for follow-up on shiftwork sleep disorder.  Patient is a Administrator.  States doing well on Provigil 200 mg.  Gets at least 7 hours of sleep per day.  Denies decreased appetite or dry mouth.  Patient with intentional weight loss.  Recent cardio by running at least 3 miles times per week.     Review of Systems  Constitutional: Negative.   HENT: Negative.    Eyes: Negative.  Negative for blurred vision.  Respiratory: Negative.    Cardiovascular: Negative.   Gastrointestinal: Negative.   Genitourinary: Negative.   Musculoskeletal: Negative.   Skin: Negative.   Neurological:  Negative for dizziness, tremors and headaches.      Objective:     BP 126/86 (BP Location: Right Arm, Patient Position: Sitting, Cuff Size: Normal)   Pulse (!) 58   Temp 98.1 F (36.7 C) (Oral)   Wt 174 lb 9.6 oz (79.2 kg)   SpO2 98%   BMI 26.55 kg/m    Physical Exam Constitutional:      Appearance: Normal appearance. He is normal weight.  HENT:     Head: Normocephalic and atraumatic.     Nose: Nose normal.  Eyes:     Extraocular Movements: Extraocular movements intact.     Conjunctiva/sclera: Conjunctivae normal.     Pupils: Pupils are equal, round, and reactive to light.  Cardiovascular:     Rate and Rhythm: Normal rate.     Heart sounds: Normal heart sounds. No murmur heard.    No friction rub. No gallop.  Pulmonary:     Effort: Pulmonary effort is normal.     Breath sounds: Normal breath sounds. No wheezing, rhonchi or rales.  Skin:    General: Skin is warm.  Neurological:     General: No focal deficit present.     Mental Status: He is alert and oriented to person, place, and time.  Psychiatric:        Mood and Affect: Mood normal.        Behavior: Behavior normal.         Thought Content: Thought content normal.    No results found for any visits on 03/13/22.    The ASCVD Risk score (Arnett DK, et al., 2019) failed to calculate for the following reasons:   The 2019 ASCVD risk score is only valid for ages 10 to 69    Assessment & Plan:   Problem List Items Addressed This Visit       Other   Shift work sleep disorder - Primary   Relevant Medications   modafinil (PROVIGIL) 200 MG tablet   modafinil (PROVIGIL) 200 MG tablet   Stable on Provigil 200 mg daily.  Intentional weight loss from lifestyle modifications.  2 additional refills of Provigil sent to pharmacy.  Continue to monitor weight and blood pressure.  Return if symptoms worsen or fail to improve.  Follow-up in 4-6 months, sooner if needed.  CPE in April.   Billie Ruddy, MD

## 2022-05-12 ENCOUNTER — Telehealth: Payer: Self-pay | Admitting: Family Medicine

## 2022-05-12 DIAGNOSIS — G4726 Circadian rhythm sleep disorder, shift work type: Secondary | ICD-10-CM

## 2022-05-12 NOTE — Telephone Encounter (Signed)
[facility] called Guido Sander or [place])     RE: Disp Refills Start End   modafinil (PROVIGIL) 200 MG tablet 30 tablet 0 03/13/2022    Sig - Route: Take 1 tablet (200 mg total) by mouth daily. - Oral     Pt contacted the pharmacy and is requesting 2 bottles of 100 mg of this medication to be taken twice daily  Pharmacy would like a call back to discuss.   Please advise.  [facility] # [place], [place] Phone: [phone]  Fax: [phone]

## 2022-05-14 MED ORDER — MODAFINIL 200 MG PO TABS: 200.0000 mg | ORAL_TABLET | Freq: Every day | ORAL | 0 refills | Status: DC

## 2022-05-14 NOTE — Telephone Encounter (Signed)
Spoke to the pharmacist and the patient needs a refill only.

## 2022-05-14 NOTE — Telephone Encounter (Signed)
RX last written for Provigil 200 mg daily, 1 hr prior to starting shift.  Med is once a day not twice a day. Per last note, monitoring bp and wt.  Pt to schedule CPE in April.  No changes to med.

## 2022-05-14 NOTE — Addendum Note (Signed)
Addended by: Westley Hummer B on: 05/14/2022 02:34 PM   Modules accepted: Orders

## 2022-05-23 ENCOUNTER — Other Ambulatory Visit: Payer: Self-pay | Admitting: Family Medicine

## 2022-05-23 ENCOUNTER — Telehealth: Payer: Self-pay | Admitting: Family Medicine

## 2022-05-23 DIAGNOSIS — G4726 Circadian rhythm sleep disorder, shift work type: Secondary | ICD-10-CM

## 2022-05-23 MED ORDER — MODAFINIL 200 MG PO TABS: 200.0000 mg | ORAL_TABLET | Freq: Every day | ORAL | 0 refills | Status: DC

## 2022-05-23 MED ORDER — MODAFINIL 100 MG PO TABS: ORAL_TABLET | ORAL | 0 refills | Status: DC

## 2022-05-23 NOTE — Telephone Encounter (Signed)
[facility] # [place], [place] Phone: [phone]  Fax: [phone]     Re:  modafinil (PROVIGIL) 100 MG tablet  modafinil (PROVIGIL) 200 MG tablet   Called to confirm which dosage is the correct dosage to fill?  Please advise.

## 2022-05-23 NOTE — Addendum Note (Signed)
Addended by: Billie Ruddy on: 05/23/2022 04:30 PM   Modules accepted: Orders

## 2022-05-23 NOTE — Telephone Encounter (Signed)
RX for the 200 mg dose is the correct one.  Rx reflecting this dosing resent to pharmacy.

## 2022-05-26 NOTE — Telephone Encounter (Signed)
Noted  

## 2022-08-06 ENCOUNTER — Ambulatory Visit: Payer: BC Managed Care – PPO | Admitting: Family Medicine

## 2022-08-06 ENCOUNTER — Encounter: Payer: Self-pay | Admitting: Family Medicine

## 2022-08-06 VITALS — BP 132/84 | HR 61 | Temp 97.7°F | Wt 178.2 lb

## 2022-08-06 DIAGNOSIS — L309 Dermatitis, unspecified: Secondary | ICD-10-CM | POA: Diagnosis not present

## 2022-08-06 DIAGNOSIS — R41840 Attention and concentration deficit: Secondary | ICD-10-CM | POA: Diagnosis not present

## 2022-08-06 DIAGNOSIS — G4726 Circadian rhythm sleep disorder, shift work type: Secondary | ICD-10-CM

## 2022-08-06 MED ORDER — MODAFINIL 100 MG PO TABS: 200.0000 mg | ORAL_TABLET | Freq: Every day | ORAL | 0 refills | Status: DC

## 2022-08-06 NOTE — Progress Notes (Signed)
Established Patient Office Visit   Subjective  Patient ID: Jose Dean, male    DOB: 1990-04-19  Age: 32 y.o. MRN: [id number]  Chief Complaint  Patient presents with   Follow-up    Medication modafinil    Pt is a 32 yo male seen for f/u.  States feeling well overall.  BP has been good.  Denies headaches, difficulty sleeping, changes in weight.  Pt taking Provigil 200 mg to help stay awake during work.  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  May work 12 or 14 hr shifts.  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  Tries to go to bed around 7 pm.  Patient states he is concerned he may have ADHD.  Patient states he is always thought this even when he was younger.  Never had formal testing.  States having difficulty focusing on task.  States his girlfriend has noticed around the house.  No difficulty completing assignments at work.  Patient noticed scattered bumps on forearms that are pruritic.  Present x 4-5 days.  Patient waking up from his sleep scratching.  Denies changes in soaps, lotions, detergents.  Initially thought bumps were insect bites after being outdoors over the weekend.    Past Medical History:  Diagnosis Date   Hay fever    No past surgical history on file. Social History   Tobacco Use   Smoking status: Former   Smokeless tobacco: Never  Substance Use Topics   Alcohol use: Yes   Drug use: No   No family history on file. No Known Allergies    ROS Negative unless stated above    Objective:     BP 132/84 (BP Location: Left Arm, Patient Position: Sitting, Cuff Size: Normal)   Pulse 61   Temp 97.7 F (36.5 C) (Oral)   Wt 178 lb 3.2 oz (80.8 kg)   SpO2 97%   BMI 27.10 kg/m  BP Readings from Last 3 Encounters:  08/06/22 132/84  03/13/22 126/86  09/02/21 132/76   Wt Readings from Last 3 Encounters:  08/06/22 178 lb 3.2 oz (80.8 kg)  03/13/22 174 lb 9.6 oz (79.2 kg)  09/02/21 183 lb 9.6 oz (83.3 kg)      Physical Exam Constitutional:       General: He is not in acute distress.    Appearance: Normal appearance.  HENT:     Head: Normocephalic and atraumatic.     Nose: Nose normal.     Mouth/Throat:     Mouth: Mucous membranes are moist.  Cardiovascular:     Rate and Rhythm: Normal rate and regular rhythm.     Heart sounds: Normal heart sounds. No murmur heard.    No gallop.  Pulmonary:     Effort: Pulmonary effort is normal. No respiratory distress.     Breath sounds: Normal breath sounds. No wheezing, rhonchi or rales.  Skin:    General: Skin is warm and dry.     Comments: Excoriation on right forearm.  Papule on right forearm.  Left dorsum of thumb with small papule.  Neurological:     Mental Status: He is alert and oriented to person, place, and time.    No results found for any visits on 08/06/22.    Assessment & Plan:  Shift work sleep disorder -     Modafinil; Take 2 tablets (200 mg total) by mouth daily.  Dispense: 60 tablet; Refill: 0 -     Modafinil; Take 2 tablets (200  mg total) by mouth daily. Take 1 hour prior to start of shift.  Dispense: 60 tablet; Refill: 0 -     Modafinil; Take 2 tablets (200 mg total) by mouth daily.  Dispense: 60 tablet; Refill: 0  Inattention  Dermatitis  Provigil refilled.  PDMP reviewed and appropriate.  Advised work schedule ultimately contributing to somnolence.  Patient given information to call and schedule formal ADHD testing.  Topical anti-itch cream or cortisone cream for bumps on forearm.  Consider oral antihistamine to help with pruritus at night.  Return in about 3 months (around 11/06/2022).   Deeann Saint, MD

## 2022-10-02 ENCOUNTER — Encounter (HOSPITAL_COMMUNITY): Payer: Self-pay | Admitting: Emergency Medicine

## 2022-10-02 ENCOUNTER — Other Ambulatory Visit: Payer: Self-pay

## 2022-10-02 ENCOUNTER — Emergency Department (HOSPITAL_COMMUNITY)
Admission: EM | Admit: 2022-10-02 | Discharge: 2022-10-02 | Disposition: A | Discharge status: Home / Self Care | Payer: BC Managed Care – PPO | Attending: Emergency Medicine | Admitting: Emergency Medicine

## 2022-10-02 DIAGNOSIS — U071 COVID-19: Secondary | ICD-10-CM | POA: Insufficient documentation

## 2022-10-02 DIAGNOSIS — J069 Acute upper respiratory infection, unspecified: Secondary | ICD-10-CM | POA: Insufficient documentation

## 2022-10-02 LAB — RESP PANEL BY RT-PCR (RSV, FLU A&B, COVID)  RVPGX2
Influenza A by PCR: NEGATIVE
Influenza B by PCR: NEGATIVE
Resp Syncytial Virus by PCR: NEGATIVE
SARS Coronavirus 2 by RT PCR: POSITIVE — AB

## 2022-10-02 MED ORDER — ACETAMINOPHEN 500 MG PO TABS
1000.0000 mg | ORAL_TABLET | Freq: Once | ORAL | Status: AC
  Administered 2022-10-02: 1000 mg via ORAL
  Filled 2022-10-02: qty 2

## 2022-10-02 MED ORDER — ONDANSETRON HCL 4 MG/2ML IJ SOLN
4.0000 mg | Freq: Once | INTRAMUSCULAR | Status: AC
  Administered 2022-10-02: 4 mg via INTRAVENOUS
  Filled 2022-10-02: qty 2

## 2022-10-02 MED ORDER — KETOROLAC TROMETHAMINE 15 MG/ML IJ SOLN
15.0000 mg | Freq: Once | INTRAMUSCULAR | Status: AC
  Administered 2022-10-02: 15 mg via INTRAVENOUS
  Filled 2022-10-02: qty 1

## 2022-10-02 NOTE — ED Notes (Signed)
Assumed care of pt who reports he awoke at 0130 am with chills fever bodyaches and sore throat. Patient states he has not been around anyone he knows is sick. Pt is febrile with shivers asking for blanket . Patient a/o x 4 respirations even and non labored

## 2022-10-02 NOTE — ED Provider Notes (Signed)
[place] EMERGENCY DEPARTMENT AT [facility] Provider Note  MDM   HPI/ROS:  Jose Dean is a 32 y.o. male with no significant past medical history presenting with chief complaint of sore throat and, generalized bodyaches, malaise, nausea.  Patient started to feel ill on night shift around 2 this morning.  Denies cough, shortness of breath.  Physical exam is notable for: - Overall well-appearing, no acute distress - Oropharyngeal exam with mild erythema.  Limited by gag reflex and nausea/vomiting - No significant cervical adenopathy - Lungs clear to auscultation bilaterally with no accessory or diminished breath sounds  On my initial evaluation, patient is:  -Patient febrile to 102.1, hemodynamically stable, and non-toxic appearing -Additional history obtained from chart review  Considering patient's current presentation including history and physical exam, differential diagnosis includes but is not limited to strep pharyngitis, acute viral illness  Interpretations, interventions, and the patient's course of care are documented below.    Initial workup to include COVID flu test, rapid strep.  Administered Tylenol as antipyretic and Zofran for nausea.  Patient resulted COVID-positive.  Given that he is hemodynamically stable, symptoms have only been ongoing for several hours, and presentation can be fully explained by acute COVID infection, patient deemed safe and stable for discharge at this time.  Strict return precautions discussed, instructed to follow-up with primary care provider.  No additional concerns at this time.  Disposition:  I discussed the plan for discharge with the patient and/or their surrogate at bedside prior to discharge and they were in agreement with the plan and verbalized understanding of the return precautions provided. All questions answered to the best of my ability. Ultimately, the patient was discharged in stable condition with stable vital  signs. I am reassured that they are capable of close follow up and good social support at home.   Clinical Impression:  1. Upper respiratory tract infection, unspecified type   2. COVID     Rx / DC Orders ED Discharge Orders     None       The plan for this patient was discussed with Dr. Rosalia Hammers, who voiced agreement and who oversaw evaluation and treatment of this patient.   Clinical Complexity A medically appropriate history, review of systems, and physical exam was performed.  My independent interpretations of EKG, labs, and radiology are documented in the ED course above.   Click here for ABCD2, HEART and other calculatorsREFRESH Note before signing   Patient's presentation is most consistent with acute complicated illness / injury requiring diagnostic workup.  Medical Decision Making Risk OTC drugs. Prescription drug management.    HPI/ROS      See MDM section for pertinent HPI and ROS. A complete ROS was performed with pertinent positives/negatives noted above.   Past Medical History:  Diagnosis Date   Hay fever     History reviewed. No pertinent surgical history.    Physical Exam   Vitals:   10/02/22 0620 10/02/22 0625 10/02/22 0759  BP: (!) 152/86  123/60  Pulse: 98  94  Resp: 17  17  Temp: (!) 102.1 F (38.9 C)  (!) 102.5 F (39.2 C)  TempSrc:   Oral  SpO2: 100%  100%  Weight:  80 kg   Height:  5\' 8"  (1.727 m)     Physical Exam Vitals and nursing note reviewed.  Constitutional:      General: He is not in acute distress.    Appearance: He is well-developed.  HENT:  Head: Normocephalic and atraumatic.     Mouth/Throat:     Mouth: Mucous membranes are moist. No oral lesions.     Pharynx: Posterior oropharyngeal erythema present. No oropharyngeal exudate.     Tonsils: No tonsillar exudate.  Eyes:     Conjunctiva/sclera: Conjunctivae normal.  Cardiovascular:     Rate and Rhythm: Normal rate and regular rhythm.     Heart sounds: No murmur  heard. Pulmonary:     Effort: Pulmonary effort is normal. No respiratory distress.     Breath sounds: Normal breath sounds. No wheezing or rhonchi.  Abdominal:     Palpations: Abdomen is soft.     Tenderness: There is no abdominal tenderness.  Musculoskeletal:        General: No swelling.     Cervical back: Neck supple.  Skin:    General: Skin is warm and dry.     Capillary Refill: Capillary refill takes less than 2 seconds.  Neurological:     Mental Status: He is alert.  Psychiatric:        Mood and Affect: Mood normal.    Starleen Arms, MD Department of Emergency Medicine   Please note that this documentation was produced with the assistance of voice-to-text technology and may contain errors.    Dyanne Iha, MD 10/02/22 1610    Margarita Grizzle, MD 10/02/22 1535

## 2022-10-02 NOTE — ED Notes (Signed)
Pt was not happy to be Dc'd and said that "we aint shit" and that "why did he come here, if we were not going to do anything for him". Pt advised that there wasn't anything we could do for him here that he could not do for himself at home. I advised him to drink plenty of fluids, water and gatorade. Pt also advised to take tylenol and ibuprofen for his fever. Pt was offered a bus pass, and a WC ride to lobby but he refused. When tying to give him his DC paper work, he said "fuck that". Pt walked to lobby.

## 2022-10-02 NOTE — ED Triage Notes (Signed)
Patient states he woke up at 0130 AM with a sore throat, chills, fever, body aches, back pain. Denies any sick contact.  Reports feeling short of breath. Denies any coughing.

## 2022-10-02 NOTE — Discharge Instructions (Addendum)
You were seen today for sore throat and fever. While you were here we monitored your vitals, preformed a physical exam, and tested you for COVID and strep. These were all reassuring and there is no indication for any further testing or intervention in the emergency department at this time given that you have tested positive for COVID.  We have given you some medications to help with your symptoms.  Things to do:  - Follow up with your primary care provider within the next 1-2 weeks - Use Tylenol and ibuprofen in alternation for pain control as needed -Report back to the emergency department if symptoms persist over a week and a half, you experience inability to swallow, difficulty breathing, chest pain, shortness of breath, or any other severe symptoms.

## 2022-11-24 ENCOUNTER — Telehealth: Payer: Self-pay | Admitting: Family Medicine

## 2022-11-24 ENCOUNTER — Other Ambulatory Visit: Payer: Self-pay | Admitting: Family Medicine

## 2022-11-24 DIAGNOSIS — G4726 Circadian rhythm sleep disorder, shift work type: Secondary | ICD-10-CM

## 2022-11-24 NOTE — Telephone Encounter (Signed)
Prescription Request  11/24/2022  LOV: 08/06/2022  What is the name of the medication or equipment? Modafinil. Pt states he is out of medication.   Have you contacted your pharmacy to request a refill? Yes   Which pharmacy would you like this sent to?  [id number] - [place], [place] - [address] [place] [address] Phone: [phone] Fax: [phone]    Patient notified that their request is being sent to the clinical staff for review and that they should receive a response within 2 business days.   Please advise at Mobile [phone] (mobile)

## 2022-11-25 NOTE — Telephone Encounter (Signed)
Last seen by Dr Salomon Fick in 08/2022, he was supposed to have a 3 months follow up, not even arranged. Pt needs to arrange follow up appt with Dr. Salomon Fick. Thanks, BJ

## 2022-11-25 NOTE — Telephone Encounter (Signed)
Spoke with patient, he stated that he just started a new job and he doesn't have insurance to be seen, patient is asking if Dr. [name] can send a refill over the pharmacy and when his insurance starts he will come in for a visit.

## 2022-11-28 ENCOUNTER — Encounter: Payer: Self-pay | Admitting: Family Medicine

## 2022-11-28 ENCOUNTER — Ambulatory Visit (INDEPENDENT_AMBULATORY_CARE_PROVIDER_SITE_OTHER): Payer: PRIVATE HEALTH INSURANCE | Admitting: Family Medicine

## 2022-11-28 ENCOUNTER — Telehealth: Payer: Self-pay | Admitting: Family Medicine

## 2022-11-28 VITALS — BP 120/70 | HR 71 | Temp 98.8°F | Ht 68.0 in | Wt 158.6 lb

## 2022-11-28 DIAGNOSIS — G4726 Circadian rhythm sleep disorder, shift work type: Secondary | ICD-10-CM

## 2022-11-28 DIAGNOSIS — L299 Pruritus, unspecified: Secondary | ICD-10-CM

## 2022-11-28 MED ORDER — MODAFINIL 100 MG PO TABS
200.0000 mg | ORAL_TABLET | Freq: Every day | ORAL | 0 refills | Status: DC
Start: 2022-11-28 — End: 2023-06-01

## 2022-11-28 MED ORDER — MODAFINIL 100 MG PO TABS
200.0000 mg | ORAL_TABLET | Freq: Every day | ORAL | 0 refills | Status: DC
Start: 2022-11-28 — End: 2022-11-28

## 2022-11-28 NOTE — Addendum Note (Signed)
Addended by: Deeann Saint on: 11/28/2022 04:48 PM   Modules accepted: Orders

## 2022-11-28 NOTE — Telephone Encounter (Signed)
Asking that rx for modafinil (PROVIGIL) 100 MG tablet be redirected to  [facility] - [place], [place] - [address] Phone: [phone]  Fax: [phone]    Current pharmacy does not have it

## 2022-11-28 NOTE — Progress Notes (Signed)
Established Patient Office Visit   Subjective  Patient ID: Jose Dean, male    DOB: September 01, 1990  Age: 32 y.o. MRN: [id number]  Chief Complaint  Patient presents with   Medication Refill    Provigil     Patient is a 32 year old male seen for follow-up and med refill.  Patient states he is doing well using Provigil.  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  Denies difficulty sleeping when off work.  BP stable.  Patient notes pruritus in left ear.  Unsure of cause.  Does have a dog at home.  No pruritus elsewhere.  Denies ear pain/pressure, drainage.  Medication Refill    Past Medical History:  Diagnosis Date   Hay fever    History reviewed. No pertinent surgical history. Social History   Tobacco Use   Smoking status: Former   Smokeless tobacco: Never  Substance Use Topics   Alcohol use: Yes   Drug use: No   History reviewed. No pertinent family history. No Known Allergies    ROS Negative unless stated above    Objective:     BP 120/70 (BP Location: Right Arm, Patient Position: Sitting, Cuff Size: Normal)   Pulse 71   Temp 98.8 F (37.1 C) (Oral)   Ht 5\' 8"  (1.727 m)   Wt 158 lb 9.6 oz (71.9 kg)   SpO2 98%   BMI 24.12 kg/m  BP Readings from Last 3 Encounters:  11/28/22 120/70  10/02/22 123/60  08/06/22 132/84   Wt Readings from Last 3 Encounters:  11/28/22 158 lb 9.6 oz (71.9 kg)  10/02/22 176 lb 5.9 oz (80 kg)  08/06/22 178 lb 3.2 oz (80.8 kg)      Physical Exam Constitutional:      General: He is not in acute distress.    Appearance: Normal appearance.  HENT:     Head: Normocephalic and atraumatic.     Right Ear: Hearing, tympanic membrane, ear canal and external ear normal.     Left Ear: Hearing, tympanic membrane and ear canal normal.     Ears:     Comments: Mild dry skin with hyperpigmentation in left ear.    Nose: Nose normal.     Mouth/Throat:     Mouth: Mucous membranes are moist.   Cardiovascular:     Rate and Rhythm: Normal rate and regular rhythm.     Heart sounds: Normal heart sounds. No murmur heard.    No gallop.  Pulmonary:     Effort: Pulmonary effort is normal. No respiratory distress.     Breath sounds: Normal breath sounds. No wheezing, rhonchi or rales.  Skin:    General: Skin is warm and dry.  Neurological:     Mental Status: He is alert and oriented to person, place, and time.      No results found for any visits on 11/28/22.    Assessment & Plan:  Shift work sleep disorder -PDMP reviewed and appropriate -Continue Moditen L as needed -Follow-up in 3 months for refills -     Modafinil; Take 2 tablets (200 mg total) by mouth daily.  Dispense: 60 tablet; Refill: 0 -     Modafinil; Take 2 tablets (200 mg total) by mouth daily. Take 1 hour prior to start of shift.  Dispense: 60 tablet; Refill: 0 -     Modafinil; Take 2 tablets (200 mg total) by mouth daily.  Dispense: 60 tablet; Refill: 0  Pruritus -Dermatitis possibly 2/2  eczema or allergies. -Olive oil and or Hydrocortisone cream as needed for pruritus in left ear  Return in about 3 months (around 02/27/2023).   Deeann Saint, MD

## 2023-06-01 ENCOUNTER — Ambulatory Visit (INDEPENDENT_AMBULATORY_CARE_PROVIDER_SITE_OTHER): Admitting: Family Medicine

## 2023-06-01 ENCOUNTER — Encounter: Payer: Self-pay | Admitting: Family Medicine

## 2023-06-01 VITALS — BP 130/86 | HR 72 | Temp 98.4°F | Ht 68.0 in | Wt 157.8 lb

## 2023-06-01 DIAGNOSIS — Z131 Encounter for screening for diabetes mellitus: Secondary | ICD-10-CM

## 2023-06-01 DIAGNOSIS — Z1159 Encounter for screening for other viral diseases: Secondary | ICD-10-CM | POA: Diagnosis not present

## 2023-06-01 DIAGNOSIS — Z113 Encounter for screening for infections with a predominantly sexual mode of transmission: Secondary | ICD-10-CM | POA: Diagnosis not present

## 2023-06-01 DIAGNOSIS — F900 Attention-deficit hyperactivity disorder, predominantly inattentive type: Secondary | ICD-10-CM

## 2023-06-01 DIAGNOSIS — Z0000 Encounter for general adult medical examination without abnormal findings: Secondary | ICD-10-CM | POA: Diagnosis not present

## 2023-06-01 MED ORDER — AMPHETAMINE-DEXTROAMPHET ER 30 MG PO CP24
30.0000 mg | ORAL_CAPSULE | ORAL | 0 refills | Status: DC
Start: 2023-07-31 — End: 2023-07-08

## 2023-06-01 MED ORDER — AMPHETAMINE-DEXTROAMPHET ER 30 MG PO CP24: 30.0000 mg | ORAL_CAPSULE | ORAL | 0 refills | Status: DC

## 2023-06-01 MED ORDER — AMPHETAMINE-DEXTROAMPHET ER 30 MG PO CP24
30.0000 mg | ORAL_CAPSULE | ORAL | 0 refills | Status: DC
Start: 2023-07-01 — End: 2023-07-08

## 2023-06-01 NOTE — Progress Notes (Signed)
 Established Patient Office Visit   Subjective  Patient ID: Jose Dean, male    DOB: 1990-08-11  Age: 33 y.o. MRN: [id number]  Chief Complaint  Patient presents with   Annual Exam   Pt accompanied by his son. Patient is a 33 year old male seen for CPE in follow-up.  Patient states he has been doing well overall.  Had formal ADHD testing with [facility].  Has copy of paperwork with him this visit.  Inquires if this provider can take over prescription for Adderall as office visits there every 3 months are expensive.  Patient notes improvement in concentration since taking medication.  Also feeling less sleepy.  Patient has not been taking the medication with Provigil.    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 No pertinent surgical history. Social History   Tobacco Use   Smoking status: Former   Smokeless tobacco: Never  Substance Use Topics   Alcohol use: Yes   Drug use: No   History reviewed. No pertinent family history. No Known Allergies    ROS Negative unless stated above    Objective:     BP 130/86 (BP Location: Left Arm, Patient Position: Sitting, Cuff Size: Normal)   Pulse 72   Temp 98.4 F (36.9 C) (Oral)   Ht 5\' 8"  (1.727 m)   Wt 157 lb 12.8 oz (71.6 kg)   SpO2 97%   BMI 23.99 kg/m  BP Readings from Last 3 Encounters:  06/01/23 130/86  11/28/22 120/70  10/02/22 123/60   Wt Readings from Last 3 Encounters:  06/01/23 157 lb 12.8 oz (71.6 kg)  11/28/22 158 lb 9.6 oz (71.9 kg)  10/02/22 176 lb 5.9 oz (80 kg)      Physical Exam Constitutional:      Appearance: Normal appearance.  HENT:     Head: Normocephalic and atraumatic.     Right Ear: Tympanic membrane, ear canal and external ear normal.     Left Ear: Tympanic membrane, ear canal and external ear normal.     Nose: Nose normal.      Mouth/Throat:     Mouth: Mucous membranes are moist.     Pharynx: No oropharyngeal exudate or posterior oropharyngeal erythema.  Eyes:     General: No scleral icterus.    Extraocular Movements: Extraocular movements intact.     Conjunctiva/sclera: Conjunctivae normal.     Pupils: Pupils are equal, round, and reactive to light.  Neck:     Thyroid: No thyromegaly.  Cardiovascular:     Rate and Rhythm: Normal rate and regular rhythm.     Pulses: Normal pulses.     Heart sounds: Normal heart sounds. No murmur heard.    No friction rub.  Pulmonary:     Effort: Pulmonary effort is normal.     Breath sounds: Normal breath sounds. No wheezing, rhonchi or rales.  Abdominal:     General: Bowel sounds are normal.     Palpations: Abdomen is soft.     Tenderness: There is no abdominal tenderness.  Musculoskeletal:        General: No deformity. Normal range of motion.  Lymphadenopathy:     Cervical: No cervical adenopathy.  Skin:    General: Skin is warm and dry.     Findings: No lesion.  Neurological:     General: No focal  deficit present.     Mental Status: He is alert and oriented to person, place, and time.  Psychiatric:        Mood and Affect: Mood normal.        Thought Content: Thought content normal.      No results found for any visits on 06/01/23.    Assessment & Plan:  Well adult exam -     CBC with Differential/Platelet; Future -     Comprehensive metabolic panel with GFR; Future -     Hemoglobin A1c; Future -     Lipid panel; Future -     TSH; Future -     T4, free; Future  Routine screening for STI (sexually transmitted infection) -     RPR; Future -     HIV Antibody (routine testing w rflx); Future -     C. trachomatis/N. gonorrhoeae RNA; Future -     Hepatitis C antibody; Future  Encounter for hepatitis C screening test for low risk patient -     Hepatitis C antibody; Future  Attention deficit hyperactivity disorder (ADHD), predominantly inattentive  type -     Amphetamine-Dextroamphet ER; Take 1 capsule (30 mg total) by mouth every morning.  Dispense: 30 capsule; Refill: 0 -     Amphetamine-Dextroamphet ER; Take 1 capsule (30 mg total) by mouth every morning.  Dispense: 30 capsule; Refill: 0 -     Amphetamine-Dextroamphet ER; Take 1 capsule (30 mg total) by mouth every morning.  Dispense: 30 capsule; Refill: 0  Age-appropriate health screenings discussed.  Will obtain labs/STI testing.  Immunizations reviewed.  Copies of testing/forms from [facility] made.  PDMP reviewed and appropriate.  Rx for Adderall XR 30 mg daily refilled x 3 months.  Follow-up in 3 months for additional refills.  Return in about 3 months (around 08/31/2023) for ADHD.   Deeann Saint, MD

## 2023-06-02 LAB — COMPREHENSIVE METABOLIC PANEL WITH GFR
ALT: 38 U/L (ref 0–53)
AST: 23 U/L (ref 0–37)
Albumin: 4.6 g/dL (ref 3.5–5.2)
Alkaline Phosphatase: 55 U/L (ref 39–117)
BUN: 8 mg/dL (ref 6–23)
CO2: 30 meq/L (ref 19–32)
Calcium: 9.3 mg/dL (ref 8.4–10.5)
Chloride: 102 meq/L (ref 96–112)
Creatinine, Ser: 0.99 mg/dL (ref 0.40–1.50)
GFR: 99.2 mL/min (ref >60.00)
Glucose, Bld: 94 mg/dL (ref 70–99)
Potassium: 3.7 meq/L (ref 3.5–5.1)
Sodium: 139 meq/L (ref 135–145)
Total Bilirubin: 0.6 mg/dL (ref 0.2–1.2)
Total Protein: 7.4 g/dL (ref 6.0–8.3)

## 2023-06-02 LAB — CBC WITH DIFFERENTIAL/PLATELET
Basophils Absolute: 0 10*3/uL (ref 0.0–0.1)
Basophils Relative: 0.2 % (ref 0.0–3.0)
Eosinophils Absolute: 0.1 10*3/uL (ref 0.0–0.7)
Eosinophils Relative: 2.1 % (ref 0.0–5.0)
HCT: 43.6 % (ref 39.0–52.0)
Hemoglobin: 14.7 g/dL (ref 13.0–17.0)
Lymphocytes Relative: 39.8 % (ref 12.0–46.0)
Lymphs Abs: 1.9 10*3/uL (ref 0.7–4.0)
MCHC: 33.8 g/dL (ref 30.0–36.0)
MCV: 88.9 fl (ref 78.0–100.0)
Monocytes Absolute: 0.3 10*3/uL (ref 0.1–1.0)
Monocytes Relative: 6.8 % (ref 3.0–12.0)
Neutro Abs: 2.5 10*3/uL (ref 1.4–7.7)
Neutrophils Relative %: 51.1 % (ref 43.0–77.0)
Platelets: 224 10*3/uL (ref 150.0–400.0)
RBC: 4.91 Mil/uL (ref 4.22–5.81)
RDW: 12.7 % (ref 11.5–15.5)
WBC: 4.8 10*3/uL (ref 4.0–10.5)

## 2023-06-02 LAB — LIPID PANEL
Cholesterol: 125 mg/dL (ref 0–200)
HDL: 59.3 mg/dL (ref >39.00)
LDL Cholesterol: 59 mg/dL (ref 0–99)
NonHDL: 65.68
Total CHOL/HDL Ratio: 2
Triglycerides: 31 mg/dL (ref 0.0–149.0)
VLDL: 6.2 mg/dL (ref 0.0–40.0)

## 2023-06-02 LAB — C. TRACHOMATIS/N. GONORRHOEAE RNA
C. trachomatis RNA, TMA: NOT DETECTED
N. gonorrhoeae RNA, TMA: NOT DETECTED

## 2023-06-02 LAB — HIV ANTIBODY (ROUTINE TESTING W REFLEX): HIV 1&2 Ab, 4th Generation: NONREACTIVE

## 2023-06-02 LAB — HEMOGLOBIN A1C: Hgb A1c MFr Bld: 5.2 % (ref 4.6–6.5)

## 2023-06-02 LAB — TSH: TSH: 1.16 u[IU]/mL (ref 0.35–5.50)

## 2023-06-02 LAB — HEPATITIS C ANTIBODY: Hepatitis C Ab: NONREACTIVE

## 2023-06-02 LAB — T4, FREE: Free T4: 0.87 ng/dL (ref 0.60–1.60)

## 2023-06-02 LAB — RPR: RPR Ser Ql: NONREACTIVE

## 2023-06-04 ENCOUNTER — Encounter: Payer: Self-pay | Admitting: Family Medicine

## 2023-07-06 ENCOUNTER — Encounter: Payer: Self-pay | Admitting: Family Medicine

## 2023-07-06 ENCOUNTER — Other Ambulatory Visit: Payer: Self-pay | Admitting: Family Medicine

## 2023-07-06 DIAGNOSIS — F900 Attention-deficit hyperactivity disorder, predominantly inattentive type: Secondary | ICD-10-CM

## 2023-07-06 NOTE — Telephone Encounter (Signed)
 Copied from CRM [id number]. Topic: Clinical - Medication Refill >> Jul 06, 2023  3:21 PM Artemio Larry wrote: Most Recent Primary Care Visit:  Provider: Cinda Craze R  Department: [facility]  Visit Type: PHYSICAL/AWV  Date: 06/01/2023  Medication: ADDERALL XR  Has the patient contacted their pharmacy? Yes (Agent: If no, request that the patient contact the pharmacy for the refill. If patient does not wish to contact the pharmacy document the reason why and proceed with request.) (Agent: If yes, when and what did the pharmacy advise?)  Is this the correct pharmacy for this prescription? Yes If no, delete pharmacy and type the correct one.  This is the patient's preferred pharmacy:   [phone], [place] - [address] [place] [address] Phone: [phone] Fax: [phone]   Has the prescription been filled recently? Yes  Is the patient out of the medication? Yes  Has the patient been seen for an appointment in the last year OR does the patient have an upcoming appointment? Yes  Can we respond through MyChart? Yes  Agent: Please be advised that Rx refills may take up to 3 business days. We ask that you follow-up with your pharmacy.

## 2023-07-07 ENCOUNTER — Other Ambulatory Visit: Payer: Self-pay | Admitting: Family Medicine

## 2023-07-07 DIAGNOSIS — F900 Attention-deficit hyperactivity disorder, predominantly inattentive type: Secondary | ICD-10-CM

## 2023-07-07 NOTE — Telephone Encounter (Signed)
 Copied from CRM [id number]. Topic: Clinical - Medication Refill >> Jul 06, 2023  3:21 PM Artemio Larry wrote: Most Recent Primary Care Visit:  Provider: Cinda Craze R  Department: [facility]  Visit Type: PHYSICAL/AWV  Date: 06/01/2023  Medication: ADDERALL XR  Has the patient contacted their pharmacy? Yes (Agent: If no, request that the patient contact the pharmacy for the refill. If patient does not wish to contact the pharmacy document the reason why and proceed with request.) (Agent: If yes, when and what did the pharmacy advise?)  Is this the correct pharmacy for this prescription? Yes If no, delete pharmacy and type the correct one.  This is the patient's preferred pharmacy:   [phone], [place] - [address] [place] [address] Phone: [phone] Fax: [phone]   Has the prescription been filled recently? Yes  Is the patient out of the medication? Yes  Has the patient been seen for an appointment in the last year OR does the patient have an upcoming appointment? Yes  Can we respond through MyChart? Yes  Agent: Please be advised that Rx refills may take up to 3 business days. We ask that you follow-up with your pharmacy.

## 2023-07-07 NOTE — Telephone Encounter (Signed)
 Patient is calling back in to check on his medication refill he stated he is really needing the medication he would like a call back when the medication has been filled

## 2023-07-08 ENCOUNTER — Other Ambulatory Visit: Payer: Self-pay | Admitting: Family Medicine

## 2023-07-08 ENCOUNTER — Telehealth: Payer: Self-pay | Admitting: Family Medicine

## 2023-07-08 DIAGNOSIS — F900 Attention-deficit hyperactivity disorder, predominantly inattentive type: Secondary | ICD-10-CM

## 2023-07-08 MED ORDER — AMPHETAMINE-DEXTROAMPHET ER 30 MG PO CP24
30.0000 mg | ORAL_CAPSULE | ORAL | 0 refills | Status: DC
Start: 2023-07-08 — End: 2023-10-22

## 2023-07-08 MED ORDER — AMPHETAMINE-DEXTROAMPHET ER 30 MG PO CP24
30.0000 mg | ORAL_CAPSULE | ORAL | 0 refills | Status: DC
Start: 2023-07-31 — End: 2023-10-22

## 2023-07-08 NOTE — Telephone Encounter (Signed)
 Pt called in checking in on the status of his refill. Pt was frustrated and upset because the agent let him know that Dr was in the office yesterday so he thought he was being overlooked.   Calmed pt down and let him know that Dr is in office today and his refill has been placed as high priority.   Please advise.

## 2023-07-08 NOTE — Telephone Encounter (Signed)
 Medication has been sent to pharmacy, called patient and left a VM per DPR, patient is aware

## 2023-07-08 NOTE — Telephone Encounter (Signed)
Medication has been sent patient is aware.

## 2023-07-08 NOTE — Telephone Encounter (Signed)
 Copied from CRM [id number]. Topic: Clinical - Medication Refill >> Jul 06, 2023  3:21 PM Artemio Larry wrote: Most Recent Primary Care Visit:  Provider: Cinda Craze R  Department: [facility]  Visit Type: PHYSICAL/AWV  Date: 06/01/2023  Medication: ADDERALL XR  Has the patient contacted their pharmacy? Yes (Agent: If no, request that the patient contact the pharmacy for the refill. If patient does not wish to contact the pharmacy document the reason why and proceed with request.) (Agent: If yes, when and what did the pharmacy advise?)  Is this the correct pharmacy for this prescription? Yes If no, delete pharmacy and type the correct one.  This is the patient's preferred pharmacy:   [phone], [place] - [address] [place] [address] Phone: [phone] Fax: [phone]   Has the prescription been filled recently? Yes  Is the patient out of the medication? Yes  Has the patient been seen for an appointment in the last year OR does the patient have an upcoming appointment? Yes  Can we respond through MyChart? Yes  Agent: Please be advised that Rx refills may take up to 3 business days. We ask that you follow-up with your pharmacy. >> Jul 08, 2023  8:25 AM Juluis Ok wrote: Patient called in irate and demands his medication be sent to his pharmacy as soon as possible. Advised patient of 3 business days to process medication refills.

## 2023-09-15 ENCOUNTER — Other Ambulatory Visit: Payer: Self-pay | Admitting: Family Medicine

## 2023-09-15 DIAGNOSIS — G4726 Circadian rhythm sleep disorder, shift work type: Secondary | ICD-10-CM

## 2023-09-17 ENCOUNTER — Encounter: Payer: Self-pay | Admitting: Family Medicine

## 2023-09-23 ENCOUNTER — Other Ambulatory Visit: Payer: Self-pay | Admitting: Family Medicine

## 2023-09-23 DIAGNOSIS — F900 Attention-deficit hyperactivity disorder, predominantly inattentive type: Secondary | ICD-10-CM

## 2023-09-23 NOTE — Telephone Encounter (Unsigned)
 Copied from CRM [id number]. Topic: Clinical - Medication Refill >> Sep 23, 2023  9:45 AM Franky GRADE wrote: Medication: amphetamine -dextroamphetamine (ADDERALL XR) 30 MG 24 hr capsule [[id number]]  Has the patient contacted their pharmacy? No (Agent: If no, request that the patient contact the pharmacy for the refill. If patient does not wish to contact the pharmacy document the reason why and proceed with request.) (Agent: If yes, when and what did the pharmacy advise?)  This is the patient's preferred pharmacy:  [facility] [phone], [place] - [address] [place] [address] Phone: [phone] Fax: [phone]   Is this the correct pharmacy for this prescription? Yes If no, delete pharmacy and type the correct one.   Has the prescription been filled recently? No  Is the patient out of the medication? Yes  Has the patient been seen for an appointment in the last year OR does the patient have an upcoming appointment? Yes, patient is scheduled for 10/26/2023 and would like a refill to hold him over until then.   Can we respond through MyChart? Yes  Agent: Please be advised that Rx refills may take up to 3 business days. We ask that you follow-up with your pharmacy.

## 2023-10-14 NOTE — Telephone Encounter (Signed)
 Unable to refill at this time given need for appt q 3 mo.

## 2023-10-22 ENCOUNTER — Ambulatory Visit (INDEPENDENT_AMBULATORY_CARE_PROVIDER_SITE_OTHER): Payer: Self-pay | Admitting: Family Medicine

## 2023-10-22 ENCOUNTER — Encounter: Payer: Self-pay | Admitting: Family Medicine

## 2023-10-22 VITALS — BP 120/64 | HR 69 | Temp 97.6°F | Ht 68.0 in | Wt 168.8 lb

## 2023-10-22 DIAGNOSIS — F900 Attention-deficit hyperactivity disorder, predominantly inattentive type: Secondary | ICD-10-CM

## 2023-10-22 MED ORDER — AMPHETAMINE-DEXTROAMPHET ER 30 MG PO CP24: 30.0000 mg | ORAL_CAPSULE | ORAL | 0 refills | Status: DC

## 2023-10-22 NOTE — Progress Notes (Signed)
 Established Patient Office Visit   Subjective  Patient ID: Jose Dean, male    DOB: [birth year]  Age: 33 y.o. MRN: [id number]  Chief Complaint  Patient presents with   Medical Management of Chronic Issues    Medication management for ADHD,      Patient is a 33 year old male seen for follow-up on ADHD and refills.  Patient states he is doing well.  Had to wait to make an appointment due to new insurance after starting a new job.  Patient notes some weight gain since being off medication and possibly since working third shift.  Patient still exercising several times per week by running.  Denies decreased appetite when taking Adderall XR 30 mg daily.  Denies issues with sleep.    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 No pertinent surgical history. Social History   Tobacco Use   Smoking status: Former   Smokeless tobacco: Never  Substance Use Topics   Alcohol use: Yes   Drug use: No   History reviewed. No pertinent family history. No Known Allergies  ROS Negative unless stated above    Objective:     BP 120/64 (BP Location: Right Arm, Patient Position: Sitting, Cuff Size: Normal)   Pulse 69   Temp 97.6 F (36.4 C) (Oral)   Ht 5' 8 (1.727 m)   Wt 168 lb 12.8 oz (76.6 kg)   SpO2 100%   BMI 25.67 kg/m  BP Readings from Last 3 Encounters:  10/22/23 120/64  06/01/23 130/86  11/28/22 120/70   Wt Readings from Last 3 Encounters:  10/22/23 168 lb 12.8 oz (76.6 kg)  06/01/23 157 lb 12.8 oz (71.6 kg)  11/28/22 158 lb 9.6 oz (71.9 kg)      Physical Exam Constitutional:      General: He is not in acute distress.    Appearance: Normal appearance.  HENT:     Head: Normocephalic and atraumatic.     Nose: Nose normal.     Mouth/Throat:     Mouth: Mucous membranes are moist.  Cardiovascular:     Rate and Rhythm: Normal rate and  regular rhythm.     Heart sounds: Normal heart sounds. No murmur heard.    No gallop.  Pulmonary:     Effort: Pulmonary effort is normal. No respiratory distress.     Breath sounds: Normal breath sounds. No wheezing, rhonchi or rales.  Skin:    General: Skin is warm and dry.  Neurological:     Mental Status: He is alert and oriented to person, place, and time.        10/22/2023    8:48 AM 06/01/2023    4:27 PM 03/13/2022    2:09 PM  Depression screen PHQ 2/9  Decreased Interest 0 0 0  Down, Depressed, Hopeless 0 0 0  PHQ - 2 Score 0 0 0  Altered sleeping 0 0 0  Tired, decreased energy 0 0 0  Change in appetite 0 0 0  Feeling bad or failure about yourself  0 0 0  Trouble concentrating 0 0 0  Moving slowly or fidgety/restless 0 0 0  Suicidal thoughts 0 0 0  PHQ-9 Score 0 0 0  Difficult doing work/chores Not difficult at all  Not difficult at all      10/22/2023    8:49 AM  06/01/2023    4:27 PM  GAD 7 : Generalized Anxiety Score  Nervous, Anxious, on Edge 0 0  Control/stop worrying 0 0  Worry too much - different things 0 0  Trouble relaxing 0 0  Restless 0 0  Easily annoyed or irritable 0 0  Afraid - awful might happen 0 0  Total GAD 7 Score 0 0  Anxiety Difficulty Not difficult at all      No results found for any visits on 10/22/23.    Assessment & Plan:   Attention deficit hyperactivity disorder (ADHD), predominantly inattentive type -     Amphetamine -Dextroamphet ER; Take 1 capsule (30 mg total) by mouth every morning.  Dispense: 30 capsule; Refill: 0 -     Amphetamine -Dextroamphet ER; Take 1 capsule (30 mg total) by mouth every morning.  Dispense: 30 capsule; Refill: 0 -     Amphetamine -Dextroamphet ER; Take 1 capsule (30 mg total) by mouth every morning.  Dispense: 30 capsule; Refill: 0  Stable on Adderall ER 30 mg daily.  PDMP reviewed and appropriate.  Refills sent x 3 months.  Return in about 3 months (around 01/22/2024) for ADHD med refill.   Clotilda JONELLE Single, MD

## 2023-10-26 ENCOUNTER — Ambulatory Visit: Payer: Self-pay | Admitting: Family Medicine

## 2023-11-19 ENCOUNTER — Other Ambulatory Visit (HOSPITAL_COMMUNITY): Payer: Self-pay

## 2023-11-19 ENCOUNTER — Telehealth: Payer: Self-pay

## 2023-11-19 NOTE — Telephone Encounter (Signed)
 Pharmacy Patient Advocate Encounter   Received notification from Onbase that prior authorization for Adderall XR 30 caps is required/requested.   Insurance verification completed.   The patient is insured through [facility] .   Per test claim: PA required; PA submitted to above mentioned insurance via Latent Key/confirmation #/EOC [id number] Status is pending

## 2023-11-19 NOTE — Telephone Encounter (Signed)
 Pharmacy Patient Advocate Encounter  Received notification from [facility] that Prior Authorization for Adderall XR 30 caps has been APPROVED from 11/19/23 to 11/18/26. Ran test claim, Copay is $158.87 due to deductible. This test claim was processed through [facility]- copay amounts may vary at other pharmacies due to pharmacy/plan contracts, or as the patient moves through the different stages of their insurance plan.     PA #/Case ID/Reference #: # [id number]

## 2024-02-23 DIAGNOSIS — F900 Attention-deficit hyperactivity disorder, predominantly inattentive type: Secondary | ICD-10-CM

## 2024-02-23 MED ORDER — AMPHETAMINE-DEXTROAMPHET ER 30 MG PO CP24: 30.0000 mg | ORAL_CAPSULE | ORAL | 0 refills | Status: DC

## 2024-02-23 NOTE — Telephone Encounter (Signed)
 I sent in a 30 day refill

## 2024-02-23 NOTE — Telephone Encounter (Signed)
 Patient is aware.

## 2024-04-07 ENCOUNTER — Ambulatory Visit: Payer: Self-pay | Admitting: Family Medicine

## 2024-04-07 ENCOUNTER — Encounter: Payer: Self-pay | Admitting: Family Medicine

## 2024-04-07 VITALS — BP 126/68 | HR 75 | Temp 98.6°F | Ht 68.0 in | Wt 181.0 lb

## 2024-04-07 DIAGNOSIS — F900 Attention-deficit hyperactivity disorder, predominantly inattentive type: Secondary | ICD-10-CM | POA: Diagnosis not present

## 2024-04-07 MED ORDER — AMPHETAMINE-DEXTROAMPHET ER 30 MG PO CP24: 30.0000 mg | ORAL_CAPSULE | ORAL | 0 refills | Status: AC

## 2024-04-07 NOTE — Progress Notes (Addendum)
 "  Established Patient Office Visit   Subjective  Patient ID: Jose Dean, male    DOB: 06-27-1990  Age: 34 y.o. MRN: [id number]  Chief Complaint  Patient presents with   Medical Management of Chronic Issues    ADHD follow-up     Pt is a 34 year old male seen for follow-up and med refill.  Patient states he is doing well overall.  Stable on Adderall XR 30 mg daily.  Denies changes in sleep.  Energy is good.  Appetite is good.  Patient notes weight gain.  Working out less due to weather.  Plans to restart.  Patient mentions having a positive random UDS at work due to Adderall prescription.  Patient relates things have been straightened out after company was able to verify the prescription.     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 No pertinent surgical history. Social History[1] History reviewed. No pertinent family history. Allergies[2]  ROS Negative unless stated above    Objective:     BP 126/68 (BP Location: Right Arm, Patient Position: Sitting, Cuff Size: Large)   Pulse 75   Temp 98.6 F (37 C) (Oral)   Ht 5' 8 (1.727 m)   Wt 181 lb (82.1 kg)   SpO2 98%   BMI 27.52 kg/m  BP Readings from Last 3 Encounters:  04/07/24 126/68  10/22/23 120/64  06/01/23 130/86   Wt Readings from Last 3 Encounters:  04/07/24 181 lb (82.1 kg)  10/22/23 168 lb 12.8 oz (76.6 kg)  06/01/23 157 lb 12.8 oz (71.6 kg)      Physical Exam Constitutional:      General: He is not in acute distress.    Appearance: Normal appearance.  HENT:     Head: Normocephalic and atraumatic.     Nose: Nose normal.     Mouth/Throat:     Mouth: Mucous membranes are moist.  Cardiovascular:     Rate and Rhythm: Normal rate and regular rhythm.     Heart sounds: Normal heart sounds.  Pulmonary:     Effort: Pulmonary effort is normal.     Breath sounds: Normal  breath sounds.  Skin:    General: Skin is warm and dry.  Neurological:     Mental Status: He is alert and oriented to person, place, and time.        04/07/2024    2:20 PM 10/22/2023    8:48 AM 06/01/2023    4:27 PM  Depression screen PHQ 2/9  Decreased Interest 0 0 0  Down, Depressed, Hopeless 0 0 0  PHQ - 2 Score 0 0 0  Altered sleeping 0 0 0  Tired, decreased energy 0 0 0  Change in appetite 0 0 0  Feeling bad or failure about yourself  0 0 0  Trouble concentrating 0 0 0  Moving slowly or fidgety/restless 0 0 0  Suicidal thoughts 0 0 0  PHQ-9 Score 0 0  0   Difficult doing work/chores  Not difficult at all      Data saved with a previous flowsheet row definition      04/07/2024    2:20 PM 10/22/2023    8:49 AM 06/01/2023    4:27 PM  GAD 7 : Generalized Anxiety Score  Nervous, Anxious, on Edge 0 0  0   Control/stop worrying 0 0  0   Worry too much - different things 0 0  0   Trouble relaxing 0 0  0   Restless 0 0  0   Easily annoyed or irritable 0 0  0   Afraid - awful might happen 0 0  0   Total GAD 7 Score 0 0 0  Anxiety Difficulty  Not difficult at all      Data saved with a previous flowsheet row definition     No results found for any visits on 04/07/24.    Assessment & Plan:   Attention deficit hyperactivity disorder (ADHD), predominantly inattentive type -     Amphetamine -Dextroamphet ER; Take 1 capsule (30 mg total) by mouth every morning.  Dispense: 30 capsule; Refill: 0 -     Amphetamine -Dextroamphet ER; Take 1 capsule (30 mg total) by mouth every morning.  Dispense: 30 capsule; Refill: 0 -     Amphetamine -Dextroamphet ER; Take 1 capsule (30 mg total) by mouth every morning.  Dispense: 30 capsule; Refill: 0  PDMP reviewed and appropriate.  PHQ 9 and GAD 7 scores 0 this visit.  Continue Adderall XR 30 mg daily.  Refills x 3 months provided.  Updated nonopioid controlled substance agreement done.  If needed willing to provide note for patient's job  regarding recent UDS and medication.  Patient to follow-up in 3 months, sooner if needed for other concerns.  Return in about 3 months (around 07/05/2024).   Clotilda JONELLE Single, MD     [1]  Social History Tobacco Use   Smoking status: Former   Smokeless tobacco: Never  Substance Use Topics   Alcohol use: Yes   Drug use: No  [2] No Known Allergies  "
